# Patient Record
Sex: Male | Born: 2000 | Race: White | Hispanic: No | Marital: Single
Health system: Midwestern US, Community
[De-identification: ages and names within clinical notes are randomized; demographics above are authoritative.]

## PROBLEM LIST (undated history)

## (undated) DIAGNOSIS — J4541 Moderate persistent asthma with (acute) exacerbation: Secondary | ICD-10-CM

## (undated) DIAGNOSIS — T7840XD Allergy, unspecified, subsequent encounter: Secondary | ICD-10-CM

## (undated) DIAGNOSIS — R062 Wheezing: Secondary | ICD-10-CM

---
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
## 2021-05-13 ENCOUNTER — Encounter: Attending: Family

## 2021-05-20 ENCOUNTER — Encounter: Attending: Family | Primary: Family

## 2021-05-20 ENCOUNTER — Ambulatory Visit: Admit: 2021-05-20 | Discharge: 2021-05-20 | Payer: PRIVATE HEALTH INSURANCE | Attending: Family | Primary: Family

## 2021-05-20 DIAGNOSIS — Z0000 Encounter for general adult medical examination without abnormal findings: Secondary | ICD-10-CM

## 2021-05-20 DIAGNOSIS — R062 Wheezing: Secondary | ICD-10-CM

## 2021-05-20 MED ORDER — ALBUTEROL SULFATE HFA 108 (90 BASE) MCG/ACT IN AERS
108 (90 Base) MCG/ACT | Freq: Four times a day (QID) | RESPIRATORY_TRACT | 1 refills | Status: DC | PRN
Start: 2021-05-20 — End: 2021-05-20

## 2021-05-20 MED ORDER — ALBUTEROL SULFATE HFA 108 (90 BASE) MCG/ACT IN AERS
10890 (90 Base) MCG/ACT | Freq: Four times a day (QID) | RESPIRATORY_TRACT | 2 refills | Status: DC | PRN
Start: 2021-05-20 — End: 2021-06-18

## 2021-05-20 MED ORDER — ALBUTEROL SULFATE HFA 108 (90 BASE) MCG/ACT IN AERS
108 (90 Base) MCG/ACT | Freq: Four times a day (QID) | RESPIRATORY_TRACT | 0 refills | Status: DC | PRN
Start: 2021-05-20 — End: 2021-05-20

## 2021-05-20 NOTE — Progress Notes (Signed)
[facility]  [facility]  [address], [place], [place] [address]  [phone]    Tim Ayers is a 21 y.o. male who presents today for his medical conditions/complaints as noted below.  Tim Ayers is c/o of New Patient    Chief Complaint   Patient presents with    New Patient     HPI:     Tim Ayers presents as a new patient to this provider. He states he hasn't been seen by a provider in many years.     Does states he has a history of asthma in which is poorly managed. States he has an extensive history of smoking/vaping. Now requires albuterol daily or else he will experience an asthma attack. Has never used a controller inhaler.     Denies other acute concerns.     Entire medical history, surgical history, family history, allergies, social history, and health maintenance items reviewed and updated as captured in the relevant section of patient's chart.          Past Medical History:   Diagnosis Date    Asthma       Past Surgical History:   Procedure Laterality Date    TONSILLECTOMY       Family History   Problem Relation Age of Onset    Asthma Mother     No Known Problems Brother        Social History     Tobacco Use    Smoking status: Former     Packs/day: 0.50     Types: Cigarettes     Start date: 04/07/2014     Passive exposure: Never    Smokeless tobacco: Never   Substance Use Topics    Alcohol use: Never        Current Outpatient Medications   Medication Sig Dispense Refill    albuterol sulfate HFA (VENTOLIN HFA) 108 (90 Base) MCG/ACT inhaler Inhale 2 puffs into the lungs 4 times daily as needed for Wheezing 18 g 2     No current facility-administered medications for this visit.       No Known Allergies    Subjective:      Review of Systems   Constitutional:  Negative for fever.   HENT:  Negative for sinus pain and sore throat.    Respiratory:  Positive for cough, chest tightness and wheezing. Negative for shortness of breath.    Cardiovascular:  Negative  for chest pain and palpitations.   Gastrointestinal:  Negative for constipation, diarrhea, nausea and vomiting.   Genitourinary:  Negative for dysuria and urgency.   Skin:  Negative for rash.   Neurological:  Negative for dizziness and weakness.     Objective:     Vitals:    05/20/21 1003   BP: 122/80   Site: Right Upper Arm   Position: Sitting   Cuff Size: Medium Adult   Pulse: 80   Resp: 12   Temp: 97.7 ??F (36.5 ??C)   TempSrc: Temporal   SpO2: 97%   Weight: 189 lb (85.7 kg)   Height: 5\' 10"  (1.778 m)       Physical Exam  Constitutional:       Appearance: Normal appearance. He is well-developed.   HENT:      Head: Normocephalic.      Right Ear: Hearing and external ear normal.      Left Ear: Hearing and external ear normal.      Nose: Nose  normal.   Eyes:      General: Lids are normal. Lids are everted, no foreign bodies appreciated.      Conjunctiva/sclera: Conjunctivae normal.      Pupils: Pupils are equal, round, and reactive to light.   Neck:      Thyroid: No thyroid mass.      Vascular: Normal carotid pulses. No carotid bruit or JVD.   Cardiovascular:      Rate and Rhythm: Normal rate and regular rhythm. No extrasystoles are present.     Chest Wall: PMI is not displaced.      Pulses:           Radial pulses are 2+ on the right side and 2+ on the left side.        Dorsalis pedis pulses are 2+ on the right side and 2+ on the left side.      Heart sounds: Normal heart sounds, S1 normal and S2 normal. Heart sounds not distant. No murmur heard.    No friction rub. No gallop. No S3 or S4 sounds.   Pulmonary:      Effort: Pulmonary effort is normal. No respiratory distress.      Breath sounds: Wheezing (Forced expiratory wheezing) present. No decreased breath sounds, rhonchi or rales.   Abdominal:      General: Bowel sounds are normal. There is no distension.      Palpations: Abdomen is soft.      Tenderness: There is no abdominal tenderness. There is no rebound.   Musculoskeletal:         General: Normal range of  motion.      Cervical back: Full passive range of motion without pain, normal range of motion and neck supple.   Skin:     General: Skin is warm and dry.   Neurological:      Cranial Nerves: No cranial nerve deficit.   Psychiatric:         Speech: Speech normal.         Behavior: Behavior normal.         Thought Content: Thought content normal.         Judgment: Judgment normal.       Assessment & Plan:     The following diagnoses and conditions are stable with no further orders unless indicated:    1. Wheezing    2. History of smoking    3. Encounter to establish care        Tim Ayers was seen today for new patient.    Diagnoses and all orders for this visit:    Wheezing  -     Discontinue: albuterol sulfate HFA (VENTOLIN HFA) 108 (90 Base) MCG/ACT inhaler; Inhale 2 puffs into the lungs 4 times daily as needed for Wheezing  -     Full PFT Study With Bronchodilator; Future  -     Discontinue: albuterol sulfate HFA (VENTOLIN HFA) 108 (90 Base) MCG/ACT inhaler; Inhale 2 puffs into the lungs 4 times daily as needed for Wheezing  -     albuterol sulfate HFA (VENTOLIN HFA) 108 (90 Base) MCG/ACT inhaler; Inhale 2 puffs into the lungs 4 times daily as needed for Wheezing    History of smoking  -     Discontinue: albuterol sulfate HFA (VENTOLIN HFA) 108 (90 Base) MCG/ACT inhaler; Inhale 2 puffs into the lungs 4 times daily as needed for Wheezing  -     Full PFT Study With Bronchodilator; Future  -  Discontinue: albuterol sulfate HFA (VENTOLIN HFA) 108 (90 Base) MCG/ACT inhaler; Inhale 2 puffs into the lungs 4 times daily as needed for Wheezing  -     albuterol sulfate HFA (VENTOLIN HFA) 108 (90 Base) MCG/ACT inhaler; Inhale 2 puffs into the lungs 4 times daily as needed for Wheezing    Recommend PFT to evaluate lungs. Symptoms suggestive of poorly controlled reactive airway. SABA for the interim time being. Likely would benefit from ICS/LABA to prevent SABA overuse/exacerbation    Encounter to establish care    Discussed  healthy lifestyle habits at length (encouraged regular exercise, healthy diet, no smoking, adequate sleep, sunscreen, safety items (car/driving, illness prevention.)    ?? A preventive eye exam performed by an eye specialist is recommended every 1-2 years to screen for glaucoma; cataracts, macular degeneration, and other eye disorders.  ?? A preventive dental visit is recommended every 6 months.  ?? Try to get at least 150 minutes of exercise per week or 10,000 steps per day on a pedometer .  ?? You need 1200-1500 mg of calcium and 1000-2000 IU of vitamin D per day. It is possible to meet your calcium requirement with diet alone, but a vitamin D supplement is usually necessary to meet this goal.  ?? When exposed to the sun, use a sunscreen that protects against both UVA and UVB radiation with an SPF of 30 or greater. Reapply every 2 to 3 hours or after sweating, drying off with a towel, or swimming.  ?? Always wear a seat belt when traveling in a car. Always wear a helmet when riding a bicycle or motorcycle.     No follow-ups on file.      Patientshould call the office immediately with new or ongoing signs or symptoms or worsening, or proceed to the emergency room.  If you are on medications which could impair your senses, you are at risk of weakness, falls,dizziness, or drowsiness.  You should be careful during activities which could place you at risk of harm, such as climbing, using stairs, operating machinery, or driving vehicles.  If you feel you cannot safely do theseactivities, you should request others to help you, or avoid the activities altogether. If you are drowsy for any other reason, you should use the same precautions as listed above.    Call if pattern of symptoms change or persists for an extended time.      Tim Ayers

## 2021-06-03 ENCOUNTER — Inpatient Hospital Stay: Admit: 2021-06-03 | Payer: PRIVATE HEALTH INSURANCE | Primary: Family

## 2021-06-03 DIAGNOSIS — R062 Wheezing: Secondary | ICD-10-CM

## 2021-06-03 MED ORDER — ALBUTEROL SULFATE HFA 108 (90 BASE) MCG/ACT IN AERS
108 (90 Base) MCG/ACT | Freq: Once | RESPIRATORY_TRACT | Status: AC
Start: 2021-06-03 — End: 2021-06-03
  Administered 2021-06-03: 15:00:00 4 via RESPIRATORY_TRACT

## 2021-06-03 MED FILL — PROVENTIL HFA 108 (90 BASE) MCG/ACT IN AERS: 108 (90 Base) MCG/ACT | RESPIRATORY_TRACT | Qty: 8

## 2021-06-03 NOTE — Procedures (Signed)
[facility]                 [address] [place], [place] [address]                               PULMONARY FUNCTION    PATIENT NAME: Tim Ayers, MAIOLO                        DOB:        22-Mar-2001  MED REC NO:   [id number]                          ROOM:  ACCOUNT NO:   [id number]                           ADMIT DATE: 06/03/2021  PROVIDER:     Lynnae January, MD    DATE OF PROCEDURE:  06/03/2021    PFT    Spirometry showed FVC 6.06 L, 111% predicted, FEV1 4.18 L, 90%  predicted, with FEV1/FVC ratio of 69%.  There was no response to  bronchodilator.  Lung volumes showed evidence of air trapping, diffusion  capacity was normal.    IMPRESSION:  Except for air trapping this is a normal PFT.  This can be  seen with asthma.  Clinical correlation is recommended.        Lynnae January, MD    D: 06/04/2021 8:58:43       T: 06/04/2021 11:25:55     [id number]  Job#: [id number]     Doc#: [id number]    CC:  Lavell Luster, CNP

## 2021-06-04 LAB — FULL PFT STUDY WITH BRONCHODILATOR
DLCO %Pred: 123 %
FEV1 %Pred-Post: 97 %
FEV1 %Pred-Pre: 90 %
FEV1/FVC %Pred-Post: 86 %
FEV1/FVC %Pred-Pre: 80 %
FVC %Pred-Post: 111 L
FVC %Pred-Pre: 111 %
TLC %Pred: 110 %

## 2021-06-06 MED ORDER — BUDESONIDE-FORMOTEROL FUMARATE 80-4.5 MCG/ACT IN AERO
RESPIRATORY_TRACT | 1 refills | Status: DC
Start: 2021-06-06 — End: 2021-06-18

## 2021-06-06 MED ORDER — METHYLPREDNISOLONE 4 MG PO TBPK
4 MG | PACK | ORAL | 0 refills | Status: AC
Start: 2021-06-06 — End: 2021-06-12

## 2021-06-06 MED ORDER — MONTELUKAST SODIUM 10 MG PO TABS
10 MG | ORAL_TABLET | Freq: Every evening | ORAL | 0 refills | Status: AC
Start: 2021-06-06 — End: 2021-08-01

## 2021-06-06 MED ORDER — DOXYCYCLINE HYCLATE 100 MG PO TABS
100 MG | ORAL_TABLET | Freq: Two times a day (BID) | ORAL | 0 refills | Status: AC
Start: 2021-06-06 — End: 2021-06-13

## 2021-06-06 MED ORDER — ONDANSETRON 4 MG PO TBDP
4 MG | ORAL_TABLET | Freq: Three times a day (TID) | ORAL | 0 refills | Status: AC | PRN
Start: 2021-06-06 — End: ?

## 2021-06-06 NOTE — Progress Notes (Signed)
[facility]  [address], [place] [address]  [phone]    Tim Ayers is a 21 y.o. male who presents today for his medical conditions/complaints as noted below.  Donnavin Vandenbrink is c/o of Follow-up (PFT Test Results, continued breathing problems. )      Chief Complaint   Patient presents with    Follow-up     PFT Test Results, continued breathing problems.        HPI:     Asthma Symptoms:  Patient complains of longstanding history of moderate chest tightness, dyspnea, wheezing, and dyspnea on exertion.  He denies any other symptoms.  He is awoken from sleep by asthma symptoms approximately 1 time(s) per night, and has been using his rescue inhaler/nebulizer 3 time(s) per day with minimal relief. Symptoms are worsening over time.  Peak flow monitoring:  Yes - recent PFT which showed air trapping.  Suspected triggers include airborne allergens, exercise, poor air quality, strong odors. He  reports that he has quit smoking. His smoking use included cigarettes. He started smoking about 7 years ago. He smoked an average of .5 packs per day. He has never been exposed to tobacco smoke. He has never used smokeless tobacco.  Current treatment includes beta agonist inhalers. Using preventive medication(s) consistently: not applicable.   Past treatment includes beta agonist inhalers, steroid/abx.  ED treatment for asthma symptoms:  No.  Asthma-related hospitalization No.  Prior PFTs: No.  Prior pneumococcal vaccination: No.  Current pulmonologist or allergist:  No.        Past Medical History:   Diagnosis Date    Asthma         Past Surgical History:   Procedure Laterality Date    TONSILLECTOMY         Family History   Problem Relation Age of Onset    Asthma Mother     No Known Problems Brother        Social History     Tobacco Use    Smoking status: Former     Packs/day: 0.50     Types: Cigarettes     Start date: 04/07/2014     Passive exposure: Never     Smokeless tobacco: Never   Substance Use Topics    Alcohol use: Never        Current Outpatient Medications   Medication Sig Dispense Refill    doxycycline hyclate (VIBRA-TABS) 100 MG tablet Take 1 tablet by mouth 2 times daily for 7 days 14 tablet 0    methylPREDNISolone (MEDROL DOSEPACK) 4 MG tablet Take by mouth. 1 kit 0    budesonide-formoterol (SYMBICORT) 80-4.5 MCG/ACT AERO Inhale 2 puffs twice per day. Rinse mouth after taking inhaler. 10.2 g 1    ondansetron (ZOFRAN-ODT) 4 MG disintegrating tablet Take 1 tablet by mouth 3 times daily as needed for Nausea or Vomiting 21 tablet 0    montelukast (SINGULAIR) 10 MG tablet Take 1 tablet by mouth nightly 30 tablet 0    albuterol sulfate HFA (VENTOLIN HFA) 108 (90 Base) MCG/ACT inhaler Inhale 2 puffs into the lungs 4 times daily as needed for Wheezing 18 g 2     No current facility-administered medications for this visit.       No Known Allergies    Subjective:      Review of Systems   Constitutional:  Positive for activity change and fatigue. Negative for appetite change and fever.   HENT:  Positive for congestion, postnasal drip, rhinorrhea, sinus pressure and sinus pain. Negative for sore throat.    Eyes:  Negative for photophobia and visual disturbance.   Respiratory:  Positive for cough, chest tightness, shortness of breath and wheezing.    Cardiovascular:  Negative for chest pain, palpitations and leg swelling.   Gastrointestinal:  Negative for abdominal distention, abdominal pain, anal bleeding, blood in stool, constipation, diarrhea, nausea and vomiting.   Genitourinary:  Negative for decreased urine volume, difficulty urinating, dysuria and urgency.   Skin:  Negative for rash.   Neurological:  Negative for dizziness and weakness.     Objective:     Vitals:    06/06/21 1106   BP: 122/78   Site: Right Upper Arm   Position: Sitting   Cuff Size: Medium Adult   Pulse: 77   Resp: 16   Temp: 97.1 ??F (36.2 ??C)   TempSrc: Temporal   SpO2: 97%   Weight: 191 lb (86.6  kg)       Physical Exam  Constitutional:       Appearance: Normal appearance. He is well-developed.   HENT:      Head: Normocephalic.      Right Ear: Hearing and external ear normal.      Left Ear: Hearing and external ear normal.      Nose: Nose normal.   Eyes:      General: Lids are normal. Lids are everted, no foreign bodies appreciated.      Conjunctiva/sclera: Conjunctivae normal.      Pupils: Pupils are equal, round, and reactive to light.   Neck:      Thyroid: No thyroid mass.      Vascular: Normal carotid pulses. No carotid bruit or JVD.   Cardiovascular:      Rate and Rhythm: Normal rate and regular rhythm. No extrasystoles are present.     Chest Wall: PMI is not displaced.      Pulses:           Radial pulses are 2+ on the right side and 2+ on the left side.        Dorsalis pedis pulses are 2+ on the right side and 2+ on the left side.      Heart sounds: Normal heart sounds, S1 normal and S2 normal. Heart sounds not distant. No murmur heard.    No friction rub. No gallop. No S3 or S4 sounds.   Pulmonary:      Effort: Pulmonary effort is normal. No respiratory distress.      Breath sounds: Examination of the right-upper field reveals wheezing. Examination of the left-upper field reveals wheezing. Examination of the right-middle field reveals wheezing. Wheezing (Inspiratory) present. No decreased breath sounds, rhonchi or rales.   Abdominal:      General: Bowel sounds are normal. There is no distension.      Palpations: Abdomen is soft.      Tenderness: There is no abdominal tenderness. There is no rebound.   Musculoskeletal:         General: Normal range of motion.      Cervical back: Full passive range of motion without pain, normal range of motion and neck supple.   Skin:     General: Skin is warm and dry.   Neurological:      Cranial Nerves: No cranial nerve deficit.   Psychiatric:         Speech: Speech normal.         Behavior: Behavior  normal.         Thought Content: Thought content normal.          Judgment: Judgment normal.       Assessment & Plan:     The following diagnoses and conditions are stable with no further orders unless indicated:    1. Moderate persistent asthma with acute exacerbation    2. Allergy, subsequent encounter        Keyshon was seen today for follow-up.    Diagnoses and all orders for this visit:    Moderate persistent asthma with acute exacerbation  -     doxycycline hyclate (VIBRA-TABS) 100 MG tablet; Take 1 tablet by mouth 2 times daily for 7 days  -     methylPREDNISolone (MEDROL DOSEPACK) 4 MG tablet; Take by mouth.  -     budesonide-formoterol (SYMBICORT) 80-4.5 MCG/ACT AERO; Inhale 2 puffs twice per day. Rinse mouth after taking inhaler.  -     ondansetron (ZOFRAN-ODT) 4 MG disintegrating tablet; Take 1 tablet by mouth 3 times daily as needed for Nausea or Vomiting    Allergy, subsequent encounter  -     montelukast (SINGULAIR) 10 MG tablet; Take 1 tablet by mouth nightly      Recommend beginning maintenance of asthma with combo inhaler to decrease the need for SABA. Will also add singulair seeing he is quite reactive to allergies in the springtime.     Steroid/abx to counter suspected sinsusitis which may be moving toward a bronchitis.     Return in about 4 weeks (around 07/04/2021).      Patientshould call the office immediately with new or ongoing signs or symptoms or worsening, or proceed to the emergency room.  If you are on medications which could impair your senses, you are at risk of weakness, falls,dizziness, or drowsiness.  You should be careful during activities which could place you at risk of harm, such as climbing, using stairs, operating machinery, or driving vehicles.  If you feel you cannot safely do theseactivities, you should request others to help you, or avoid the activities altogether. If you are drowsy for any other reason, you should use the same precautions as listed above.    Call if pattern of symptoms change or persists for an extended time.      Gayleen Orem

## 2021-06-07 MED FILL — VENTOLIN HFA 108 (90 BASE) MCG/ACT IN AERS: 108 (90 Base) MCG/ACT | RESPIRATORY_TRACT | Qty: 4

## 2021-06-14 ENCOUNTER — Emergency Department: Admit: 2021-06-15 | Payer: PRIVATE HEALTH INSURANCE | Primary: Family

## 2021-06-14 DIAGNOSIS — R1013 Epigastric pain: Secondary | ICD-10-CM

## 2021-06-14 NOTE — ED Provider Notes (Signed)
[facility]  [facility]  EMERGENCY DEPARTMENT ENCOUNTER        Pt Name: Tim RoundJesse Ayers  MRN: [id number]  Birthdate 2000-12-02  Date of evaluation: 06/14/2021  Provider: Mee Hiveshristina Zerick Prevette, PA  PCP: Lavell LusterBen Werneke, APRN - CNP  Note Started: 8:10 PM EST 06/14/21      APP. I have evaluated this patient.  My supervising physician was available for consultation.      CHIEF COMPLAINT       Chief Complaint   Patient presents with    Abdominal Pain     Mid abd pain starting today. Emesis. 7/10 pain         HISTORY OF PRESENT ILLNESS: 1 or more Elements     History from : Patient    Limitations to history : None    Tim Ayers is a 21 y.o. male who presents to the emergency department complaining of abdominal pain.  Patient states pain is epigastric and around his bellybutton.  He has had several episodes of nausea and vomiting.  Currently rates pain as 7/10.  Patient states he has past medical history of asthma.  He states he recently was seen by his primary care physician for asthma exacerbation and was prescribed steroids and antibiotics.  Just finished prescriptions yesterday.  As he was throwing up today he had a couple streaks of bright red blood in the vomit.     Nursing Notes were all reviewed and agreed with or any disagreements were addressed in the HPI.    REVIEW OF SYSTEMS :      Review of Systems    Positives and Pertinent negatives as per HPI.     SURGICAL HISTORY     Past Surgical History:   Procedure Laterality Date    TONSILLECTOMY         CURRENTMEDICATIONS       Discharge Medication List as of 06/14/2021  9:57 PM        CONTINUE these medications which have NOT CHANGED    Details   ondansetron (ZOFRAN-ODT) 4 MG disintegrating tablet Take 1 tablet by mouth 3 times daily as needed for Nausea or Vomiting, Disp-21 tablet, R-0Normal      montelukast (SINGULAIR) 10 MG tablet Take 1 tablet by mouth nightly, Disp-30 tablet, R-0Normal      budesonide-formoterol (SYMBICORT) 80-4.5 MCG/ACT AERO Inhale 2 puffs twice  per day. Rinse mouth after taking inhaler., Disp-10.2 g, R-1Normal      albuterol sulfate HFA (VENTOLIN HFA) 108 (90 Base) MCG/ACT inhaler Inhale 2 puffs into the lungs 4 times daily as needed for Wheezing, Disp-18 g, R-2Normal             ALLERGIES     Claritin [loratadine] and Cetirizine    FAMILYHISTORY       Family History   Problem Relation Age of Onset    Asthma Mother     No Known Problems Brother         SOCIAL HISTORY       Social History     Tobacco Use    Smoking status: Former     Packs/day: 0.50     Types: Cigarettes     Start date: 04/07/2014     Passive exposure: Never    Smokeless tobacco: Never   Substance Use Topics    Alcohol use: Never    Drug use: Yes     Types: Marijuana (Weed)       SCREENINGS  Glasgow Coma Scale  Eye Opening: Spontaneous  Best Verbal Response: Oriented  Best Motor Response: Obeys commands  Glasgow Coma Scale Score: 15                CIWA Assessment  BP: 125/60  Heart Rate: 95           PHYSICAL EXAM  1 or more Elements     ED Triage Vitals [06/14/21 1957]   BP Temp Temp Source Heart Rate Resp SpO2 Height Weight   125/60 98.6 ??F (37 ??C) Oral (!) 128 18 97 % 5\' 10"  (1.778 m) 190 lb (86.2 kg)       Physical Exam  Vitals and nursing note reviewed.   Constitutional:       Appearance: Normal appearance. He is not diaphoretic.   HENT:      Head: Normocephalic and atraumatic.      Nose: Nose normal.      Mouth/Throat:      Mouth: Mucous membranes are moist.   Eyes:      General:         Right eye: No discharge.         Left eye: No discharge.      Extraocular Movements: Extraocular movements intact.      Pupils: Pupils are equal, Ayers, and reactive to light.   Pulmonary:      Effort: Pulmonary effort is normal. No respiratory distress.   Abdominal:      General: Abdomen is flat.      Palpations: Abdomen is soft.      Tenderness: There is abdominal tenderness in the epigastric area. There is no guarding or rebound.   Musculoskeletal:         General: Normal range of motion.       Cervical back: Normal range of motion and neck supple.   Skin:     General: Skin is warm and dry.      Coloration: Skin is not pale.   Neurological:      Mental Status: He is alert and oriented to person, place, and time.   Psychiatric:         Mood and Affect: Mood normal.         Behavior: Behavior normal.           DIAGNOSTIC RESULTS   LABS:    Labs Reviewed   CBC WITH AUTO DIFFERENTIAL - Abnormal; Notable for the following components:       Result Value    WBC 19.0 (*)     Neutrophils Absolute 15.8 (*)     All other components within normal limits   COMPREHENSIVE METABOLIC PANEL W/ REFLEX TO MG FOR LOW K - Abnormal; Notable for the following components:    Sodium 133 (*)     CO2 19 (*)     Glucose 119 (*)     Creatinine 0.8 (*)     Total Bilirubin 1.2 (*)     All other components within normal limits   URINALYSIS WITH REFLEX TO CULTURE - Abnormal; Notable for the following components:    Ketones, Urine 15 (*)     Blood, Urine TRACE-INTACT (*)     Leukocyte Esterase, Urine TRACE (*)     All other components within normal limits   MICROSCOPIC URINALYSIS - Abnormal; Notable for the following components:    Mucus, UA 3+ (*)     Bacteria, UA Rare (*)     All other components within normal  limits   LIPASE   LACTIC ACID       When ordered only abnormal lab results are displayed. All other labs were within normal range or not returned as of this dictation.    EKG: When ordered, EKG's are interpreted by the Emergency Department Physician in the absence of a cardiologist.  Please see their note for interpretation of EKG.    RADIOLOGY:   Non-plain film images such as CT, Ultrasound and MRI are read by the radiologist. Plain radiographic images are visualized and preliminarily interpreted by the ED Provider with the below findings:    CT abdomen pelvis without acute process.    Interpretation per the Radiologist below, if available at the time of this note:    CT ABDOMEN PELVIS W IV CONTRAST Additional Contrast? None    Final Result   No acute infectious or inflammatory process in the abdomen or pelvis.           No results found.    No results found.    PROCEDURES   Unless otherwise noted below, none     Procedures    CRITICAL CARE TIME (.cctime)   None    PAST MEDICAL HISTORY      has a past medical history of Asthma.     Chronic Conditions affecting Care: Asthma    EMERGENCY DEPARTMENT COURSE and DIFFERENTIAL DIAGNOSIS/MDM:   Vitals:    Vitals:    06/14/21 1957 06/14/21 2145   BP: 125/60    Pulse: (!) 128 95   Resp: 18 18   Temp: 98.6 ??F (37 ??C)    TempSrc: Oral    SpO2: 97% 99%   Weight: 190 lb (86.2 kg)    Height: 5\' 10"  (1.778 m)        Patient was given the following medications:  Medications   0.9 % sodium chloride bolus (0 mLs IntraVENous Stopped 06/14/21 2148)   ketorolac (TORADOL) injection 15 mg (15 mg IntraVENous Given 06/14/21 2025)   ondansetron (ZOFRAN) injection 4 mg (4 mg IntraVENous Given 06/14/21 2025)   aluminum & magnesium hydroxide-simethicone (MAALOX) 30 mL, lidocaine viscous hcl (XYLOCAINE) 5 mL (GI COCKTAIL) ( Oral Given 06/14/21 2026)   iopamidol (ISOVUE-370) 76 % injection 75 mL (75 mLs IntraVENous Given 06/14/21 2102)             Is this patient to be included in the SEP-1 Core Measure due to severe sepsis or septic shock?   No   Exclusion criteria - the patient is NOT to be included for SEP-1 Core Measure due to:  Alternative explanation for abnormal labs/vitals that do not relate to sepsis, see MDM for further explanation    CONSULTS: (Who and What was discussed)  None  Discussion with Other Profesionals : None    Social Determinants : None    Records Reviewed : None    CC/HPI Summary, DDx, ED Course, and Reassessment: Patient was evaluated in the emergency department today for epigastric abdominal pain, nausea and vomiting.  He did try taking Zofran prior to arrival.  Patient was recently treated with antibiotics for bronchitis.  Differential diagnosis includes dyspepsia, appendicitis, pancreatitis,  gastroenteritis, kidney stone, other.    Work-up results include:  CBC with leukocytosis of 19 with increased absolute neutrophil count of 15.8.  No acute anemia.  CMP with sodium of 133, CO2 is 19, glucose is 119, renal function maintained.  No transaminitis, slightly elevated total bilirubin at 1.2 although I think suspect that this is  due to dehydration.  Lipase is 35  Urinalysis with ketones and trace blood intact.  Micro urinalysis with 3+ mucus and rare bacteria  CT abdomen and pelvis shows no acute infectious or inflammatory process.    Patient was given Toradol, Zofran and IV fluids for symptom relief.  Also given GI cocktail.  Upon reevaluation the patient's symptoms have almost completely resolved at this point.  Discussion was had with the patient regarding all lab and imaging results.  At this point I do feel patient is appropriate for discharge home.  He will follow-up with his primary care physician for further evaluation and treatment in the next 3 days.  Return precautions are given.  All questions are answered he is discharged home in good condition.    Disposition Considerations (include 1 Tests not done, Shared Decision Making, Pt Expectation of Test or Tx.):       Results for orders placed or performed during the hospital encounter of 06/14/21   CBC with Diff   Result Value Ref Range    WBC 19.0 (H) 4.0 - 11.0 K/uL    RBC 5.51 4.20 - 5.90 M/uL    Hemoglobin 16.5 13.5 - 17.5 g/dL    Hematocrit 16.1 09.6 - 52.5 %    MCV 87.4 80.0 - 100.0 fL    MCH 30.0 26.0 - 34.0 pg    MCHC 34.3 31.0 - 36.0 g/dL    RDW 04.5 40.9 - 81.1 %    Platelets 278 135 - 450 K/uL    MPV 8.7 5.0 - 10.5 fL    Neutrophils % 83.3 %    Lymphocytes % 9.7 %    Monocytes % 5.4 %    Eosinophils % 1.3 %    Basophils % 0.3 %    Neutrophils Absolute 15.8 (H) 1.7 - 7.7 K/uL    Lymphocytes Absolute 1.8 1.0 - 5.1 K/uL    Monocytes Absolute 1.0 0.0 - 1.3 K/uL    Eosinophils Absolute 0.2 0.0 - 0.6 K/uL    Basophils Absolute 0.1 0.0 - 0.2  K/uL   CMP w/ Reflex to MG   Result Value Ref Range    Sodium 133 (L) 136 - 145 mmol/L    Potassium reflex Magnesium 4.3 3.5 - 5.1 mmol/L    Chloride 99 99 - 110 mmol/L    CO2 19 (L) 21 - 32 mmol/L    Anion Gap 15 3 - 16    Glucose 119 (H) 70 - 99 mg/dL    BUN 15 7 - 20 mg/dL    Creatinine 0.8 (L) 0.9 - 1.3 mg/dL    Est, Glom Filt Rate >60 >60    Calcium 9.5 8.3 - 10.6 mg/dL    Total Protein 7.2 6.4 - 8.2 g/dL    Albumin 4.5 3.4 - 5.0 g/dL    Albumin/Globulin Ratio 1.7 1.1 - 2.2    Total Bilirubin 1.2 (H) 0.0 - 1.0 mg/dL    Alkaline Phosphatase 60 40 - 129 U/L    ALT 15 10 - 40 U/L    AST 19 15 - 37 U/L   Lipase   Result Value Ref Range    Lipase 35.0 13.0 - 60.0 U/L   Urinalysis with Reflex to Culture    Specimen: Urine   Result Value Ref Range    Color, UA Yellow Straw/Yellow    Clarity, UA Clear Clear    Glucose, Ur Negative Negative mg/dL    Bilirubin Urine Negative Negative  Ketones, Urine 15 (A) Negative mg/dL    Specific Gravity, UA 1.025 1.005 - 1.030    Blood, Urine TRACE-INTACT (A) Negative    pH, UA 6.0 5.0 - 8.0    Protein, UA Negative Negative mg/dL    Urobilinogen, Urine 0.2 <2.0 E.U./dL    Nitrite, Urine Negative Negative    Leukocyte Esterase, Urine TRACE (A) Negative    Microscopic Examination YES     Urine Type NotGiven     Urine Reflex to Culture Not Indicated    Microscopic Urinalysis   Result Value Ref Range    Mucus, UA 3+ (A) None Seen /LPF    WBC, UA 3-5 0 - 5 /HPF    RBC, UA 3-4 0 - 4 /HPF    Epithelial Cells, UA 0-1 0 - 5 /HPF    Bacteria, UA Rare (A) None Seen /HPF    Amorphous, UA 2+ /HPF   Lactic Acid   Result Value Ref Range    Lactic Acid 1.0 0.4 - 2.0 mmol/L         I estimate there is LOW risk for ACUTE APPENDICITIS, BOWEL OBSTRUCTION, CHOLECYSTITIS, DIVERTICULITIS, INCARCERATED HERNIA, PANCREATITIS, or PERFORATED BOWEL or ULCER, thus I consider the discharge disposition reasonable. Also, there is no evidence or peritonitis, sepsis, or toxicity. Tim Ayers and I have discussed the  diagnosis and risks, and we agree with discharging home to follow-up with their primary doctor. We also discussed returning to the Emergency Department immediately if new or worsening symptoms occur. We have discussed the symptoms which are most concerning (e.g., bloody stool, fever, changing or worsening pain, vomiting) that necessitate immediate return.     FINAL Impression    1. Epigastric pain        Blood pressure 125/60, pulse 95, temperature 98.6 ??F (37 ??C), temperature source Oral, resp. rate 18, height  (1.778 m), weight 190 lb (86.2 kg), SpO2 99 %.      I am the Primary Clinician of Record.    FINAL IMPRESSION      1. Epigastric pain          DISPOSITION/PLAN     DISPOSITION Decision To Discharge 06/14/2021 09:54:47 PM      PATIENT REFERRED TO:  [facility]  ED  [address]  [place] [address]  [phone]  Go to   If symptoms worsen    Lavell Luster, APRN - CNP  [address]  [address] [place] [address]  [phone]          DISCHARGE MEDICATIONS:  Discharge Medication List as of 06/14/2021  9:57 PM        START taking these medications    Details   promethazine (PHENERGAN) 12.5 MG tablet Take 2 tablets by mouth every 6 hours as needed for Nausea, Disp-20 tablet, R-0Normal             DISCONTINUED MEDICATIONS:  Discharge Medication List as of 06/14/2021  9:57 PM                 (Please note that portions of this note were completed with a voice recognition program.  Efforts were made to edit the dictations but occasionally words are mis-transcribed.)    Mee Hives, PA (electronically signed)           Mee Hives, PA  06/18/21 1752

## 2021-06-15 ENCOUNTER — Encounter: Payer: PRIVATE HEALTH INSURANCE | Primary: Family

## 2021-06-15 ENCOUNTER — Inpatient Hospital Stay
Admit: 2021-06-15 | Discharge: 2021-06-15 | Disposition: A | Discharge status: Home / Self Care | Payer: PRIVATE HEALTH INSURANCE | Attending: Emergency Medicine

## 2021-06-15 LAB — LACTIC ACID: Lactic Acid: 1 mmol/L (ref 0.4–2.0)

## 2021-06-15 LAB — COMPREHENSIVE METABOLIC PANEL W/ REFLEX TO MG FOR LOW K
ALT: 15 U/L (ref 10–40)
AST: 19 U/L (ref 15–37)
Albumin/Globulin Ratio: 1.7 (ref 1.1–2.2)
Albumin: 4.5 g/dL (ref 3.4–5.0)
Alkaline Phosphatase: 60 U/L (ref 40–129)
Anion Gap: 15 (ref 3–16)
BUN: 15 mg/dL (ref 7–20)
CO2: 19 mmol/L — ABNORMAL LOW (ref 21–32)
Calcium: 9.5 mg/dL (ref 8.3–10.6)
Chloride: 99 mmol/L (ref 99–110)
Creatinine: 0.8 mg/dL — ABNORMAL LOW (ref 0.9–1.3)
Est, Glom Filt Rate: >60 (ref >60)
Glucose: 119 mg/dL — ABNORMAL HIGH (ref 70–99)
Potassium reflex Magnesium: 4.3 mmol/L (ref 3.5–5.1)
Sodium: 133 mmol/L — ABNORMAL LOW (ref 136–145)
Total Bilirubin: 1.2 mg/dL — ABNORMAL HIGH (ref 0.0–1.0)
Total Protein: 7.2 g/dL (ref 6.4–8.2)

## 2021-06-15 LAB — URINALYSIS WITH REFLEX TO CULTURE
Bilirubin Urine: NEGATIVE
Glucose, Ur: NEGATIVE mg/dL
Ketones, Urine: 15 mg/dL — AB
Nitrite, Urine: NEGATIVE
Protein, UA: NEGATIVE mg/dL
Specific Gravity, UA: 1.025 (ref 1.005–1.030)
Urobilinogen, Urine: 0.2 E.U./dL (ref <2.0)
pH, UA: 6 (ref 5.0–8.0)

## 2021-06-15 LAB — MICROSCOPIC URINALYSIS

## 2021-06-15 LAB — CBC WITH AUTO DIFFERENTIAL
Basophils %: 0.3 %
Basophils Absolute: 0.1 10*3/uL (ref 0.0–0.2)
Eosinophils %: 1.3 %
Eosinophils Absolute: 0.2 10*3/uL (ref 0.0–0.6)
Hematocrit: 48.2 % (ref 40.5–52.5)
Hemoglobin: 16.5 g/dL (ref 13.5–17.5)
Lymphocytes %: 9.7 %
Lymphocytes Absolute: 1.8 10*3/uL (ref 1.0–5.1)
MCH: 30 pg (ref 26.0–34.0)
MCHC: 34.3 g/dL (ref 31.0–36.0)
MCV: 87.4 fL (ref 80.0–100.0)
MPV: 8.7 fL (ref 5.0–10.5)
Monocytes %: 5.4 %
Monocytes Absolute: 1 10*3/uL (ref 0.0–1.3)
Neutrophils %: 83.3 %
Neutrophils Absolute: 15.8 10*3/uL — ABNORMAL HIGH (ref 1.7–7.7)
Platelets: 278 10*3/uL (ref 135–450)
RBC: 5.51 M/uL (ref 4.20–5.90)
RDW: 13 % (ref 12.4–15.4)
WBC: 19 10*3/uL — ABNORMAL HIGH (ref 4.0–11.0)

## 2021-06-15 LAB — LIPASE: Lipase: 35 U/L (ref 13.0–60.0)

## 2021-06-15 MED ORDER — LIDOCAINE VISCOUS HCL 2 % MT SOLN
2 % | Freq: Once | OROMUCOSAL | Status: AC
Start: 2021-06-15 — End: 2021-06-14
  Administered 2021-06-15: 01:00:00 via ORAL

## 2021-06-15 MED ORDER — PROMETHAZINE HCL 12.5 MG PO TABS
12.5 MG | ORAL_TABLET | Freq: Four times a day (QID) | ORAL | 0 refills | Status: DC | PRN
Start: 2021-06-15 — End: 2021-06-18

## 2021-06-15 MED ORDER — IOPAMIDOL 76 % IV SOLN
76 % | Freq: Once | INTRAVENOUS | Status: AC | PRN
Start: 2021-06-15 — End: 2021-06-14
  Administered 2021-06-15: 02:00:00 75 mL via INTRAVENOUS

## 2021-06-15 MED ORDER — SODIUM CHLORIDE 0.9 % IV BOLUS
0.9 % | Freq: Once | INTRAVENOUS | Status: AC
Start: 2021-06-15 — End: 2021-06-14
  Administered 2021-06-15: 01:00:00 1000 mL via INTRAVENOUS

## 2021-06-15 MED ORDER — ONDANSETRON HCL 4 MG/2ML IJ SOLN
4 MG/2ML | Freq: Once | INTRAMUSCULAR | Status: AC
Start: 2021-06-15 — End: 2021-06-14
  Administered 2021-06-15: 01:00:00 4 mg via INTRAVENOUS

## 2021-06-15 MED ORDER — KETOROLAC TROMETHAMINE 30 MG/ML IJ SOLN
30 MG/ML | Freq: Once | INTRAMUSCULAR | Status: AC
Start: 2021-06-15 — End: 2021-06-14
  Administered 2021-06-15: 01:00:00 15 mg via INTRAVENOUS

## 2021-06-15 MED FILL — ONDANSETRON HCL 4 MG/2ML IJ SOLN: 4 MG/2ML | INTRAMUSCULAR | Qty: 2

## 2021-06-15 MED FILL — KETOROLAC TROMETHAMINE 30 MG/ML IJ SOLN: 30 MG/ML | INTRAMUSCULAR | Qty: 1

## 2021-06-15 MED FILL — MAG-AL PLUS 200-200-20 MG/5ML PO LIQD: 200-200-20 MG/5ML | ORAL | Qty: 30

## 2021-06-18 ENCOUNTER — Ambulatory Visit: Admit: 2021-06-18 | Discharge: 2021-06-18 | Payer: PRIVATE HEALTH INSURANCE | Attending: Family | Primary: Family

## 2021-06-18 DIAGNOSIS — R1110 Vomiting, unspecified: Secondary | ICD-10-CM

## 2021-06-18 MED ORDER — BUDESONIDE-FORMOTEROL FUMARATE 80-4.5 MCG/ACT IN AERO
80-4.5 MCG/ACT | RESPIRATORY_TRACT | 1 refills | Status: DC
Start: 2021-06-18 — End: 2021-08-01

## 2021-06-18 MED ORDER — ALBUTEROL SULFATE HFA 108 (90 BASE) MCG/ACT IN AERS
10890 (90 Base) MCG/ACT | Freq: Four times a day (QID) | RESPIRATORY_TRACT | 2 refills | Status: DC | PRN
Start: 2021-06-18 — End: 2021-08-01

## 2021-06-18 NOTE — Progress Notes (Signed)
[facility]  [facility]  [address] [place], [place] [address]  Tel:[phone]    Tim Ayers is a 21 y.o. male who presents today for his medical conditions/complaints as noted below.  Tim Ayers is c/o of Emesis (Follow Up from ER visit. Intense vomiting. ) and Follow-up (Follow Up from ER visit. Intense vomiting. )      Chief Complaint   Patient presents with    Emesis     Follow Up from ER visit. Intense vomiting.     Follow-up     Follow Up from ER visit. Intense vomiting.        HPI:     Tim Ayers presents after recent ER visit for intractable vomiting over a 1 day period. He states he returned from work at the end of the day and began to violently vomit. He states he vomited for several hours before noting blood in his vomitus and decided to go to the ER. He described upper abdominal pain, fatigue, hot flashes. He did take a dose of ondansetron and prilosec before going to the ER.     No abnormal results during his ER visit. Denies suspicious food intake. Denies exposure to illness. Denies diarrhea, current abdominal pain, bloating/cramping, acid reflux. States he has not had to take zofran since returning home.     Past Medical History:   Diagnosis Date    Asthma         Past Surgical History:   Procedure Laterality Date    TONSILLECTOMY         Family History   Problem Relation Age of Onset    Asthma Mother     No Known Problems Brother        Social History     Tobacco Use    Smoking status: Former     Packs/day: 0.50     Types: Cigarettes     Start date: 04/07/2014     Passive exposure: Never    Smokeless tobacco: Never   Substance Use Topics    Alcohol use: Never        Current Outpatient Medications   Medication Sig Dispense Refill    albuterol sulfate HFA (VENTOLIN HFA) 108 (90 Base) MCG/ACT inhaler Inhale 2 puffs into the lungs 4 times daily as needed for Wheezing 18 g 2    budesonide-formoterol (SYMBICORT) 80-4.5 MCG/ACT AERO Inhale 2 puffs twice  per day. Rinse mouth after taking inhaler. 10.2 g 1    ondansetron (ZOFRAN-ODT) 4 MG disintegrating tablet Take 1 tablet by mouth 3 times daily as needed for Nausea or Vomiting 21 tablet 0    montelukast (SINGULAIR) 10 MG tablet Take 1 tablet by mouth nightly 30 tablet 0     No current facility-administered medications for this visit.       Allergies   Allergen Reactions    Claritin [Loratadine] Hives    Cetirizine Rash       Subjective:      Review of Systems   Constitutional:  Negative for fever.   HENT:  Negative for sinus pain and sore throat.    Respiratory:  Negative for cough, chest tightness and shortness of breath.    Cardiovascular:  Negative for chest pain and palpitations.   Gastrointestinal:  Positive for vomiting. Negative for constipation, diarrhea and nausea.   Genitourinary:  Negative for dysuria and urgency.   Skin:  Negative for rash.   Neurological:  Negative for dizziness and  weakness.     Objective:     Vitals:    06/18/21 1046   BP: 116/72   Site: Right Upper Arm   Position: Sitting   Cuff Size: Medium Adult   Pulse: 67   Resp: 12   Temp: 97.6 ??F (36.4 ??C)   TempSrc: Temporal   SpO2: 97%   Weight: 190 lb (86.2 kg)       Physical Exam  Constitutional:       Appearance: Normal appearance. He is well-developed.   HENT:      Head: Normocephalic.      Right Ear: Hearing and external ear normal.      Left Ear: Hearing and external ear normal.      Nose: Nose normal.   Eyes:      General: Lids are normal. Lids are everted, no foreign bodies appreciated.      Conjunctiva/sclera: Conjunctivae normal.      Pupils: Pupils are equal, round, and reactive to light.   Neck:      Thyroid: No thyroid mass.      Vascular: Normal carotid pulses. No carotid bruit or JVD.   Cardiovascular:      Rate and Rhythm: Normal rate and regular rhythm. No extrasystoles are present.     Chest Wall: PMI is not displaced.      Pulses:           Radial pulses are 2+ on the right side and 2+ on the left side.        Dorsalis  pedis pulses are 2+ on the right side and 2+ on the left side.      Heart sounds: Normal heart sounds, S1 normal and S2 normal. Heart sounds not distant. No murmur heard.    No friction rub. No gallop. No S3 or S4 sounds.   Pulmonary:      Effort: Pulmonary effort is normal. No respiratory distress.      Breath sounds: Normal breath sounds. No decreased breath sounds, wheezing, rhonchi or rales.   Abdominal:      General: Bowel sounds are normal. There is no distension.      Palpations: Abdomen is soft.      Tenderness: There is no abdominal tenderness. There is no rebound.   Musculoskeletal:         General: Normal range of motion.      Cervical back: Full passive range of motion without pain, normal range of motion and neck supple.   Skin:     General: Skin is warm and dry.   Neurological:      Cranial Nerves: No cranial nerve deficit.   Psychiatric:         Speech: Speech normal.         Behavior: Behavior normal.         Thought Content: Thought content normal.         Judgment: Judgment normal.       Assessment & Plan:     The following diagnoses and conditions are stable with no further orders unless indicated:    1. Vomiting, unspecified vomiting type, unspecified whether nausea present    2. Wheezing    3. History of smoking    4. Moderate persistent asthma with acute exacerbation        Tim Ayers was seen today for emesis and follow-up.    Diagnoses and all orders for this visit:    Vomiting, unspecified vomiting type, unspecified whether nausea present  Symptoms largely resolved. Encouraged bland, liquid based diet if symptoms return. Zofran PRN for nausea/vomiting. Denies urinary symptoms including frequency, burning, discharge.     Wheezing  -     albuterol sulfate HFA (VENTOLIN HFA) 108 (90 Base) MCG/ACT inhaler; Inhale 2 puffs into the lungs 4 times daily as needed for Wheezing    History of smoking  -     albuterol sulfate HFA (VENTOLIN HFA) 108 (90 Base) MCG/ACT inhaler; Inhale 2 puffs into the lungs 4  times daily as needed for Wheezing    Moderate persistent asthma with acute exacerbation  -     budesonide-formoterol (SYMBICORT) 80-4.5 MCG/ACT AERO; Inhale 2 puffs twice per day. Rinse mouth after taking inhaler.    Breathing symptoms have improved with symbicort. Continue current plan of care. Albuterol PRN    Reviewed external imaging/testing from previous provider to more thoroughly evaluate current condition. This includes: ER provider notes, imaging, lab results    Return if symptoms worsen or fail to improve.      Patientshould call the office immediately with new or ongoing signs or symptoms or worsening, or proceed to the emergency room.  If you are on medications which could impair your senses, you are at risk of weakness, falls,dizziness, or drowsiness.  You should be careful during activities which could place you at risk of harm, such as climbing, using stairs, operating machinery, or driving vehicles.  If you feel you cannot safely do theseactivities, you should request others to help you, or avoid the activities altogether. If you are drowsy for any other reason, you should use the same precautions as listed above.    Call if pattern of symptoms change or persists for an extended time.      Wess BottsBen Juaquina Machnik,FNP-C

## 2021-07-16 ENCOUNTER — Encounter
Admit: 2021-07-16 | Discharge: 2021-07-16 | Payer: PRIVATE HEALTH INSURANCE | Attending: Registered Nurse | Primary: Family

## 2021-07-16 ENCOUNTER — Encounter: Payer: PRIVATE HEALTH INSURANCE | Primary: Family

## 2021-07-16 DIAGNOSIS — J069 Acute upper respiratory infection, unspecified: Secondary | ICD-10-CM

## 2021-07-16 NOTE — Progress Notes (Signed)
Reviewed questionnaire    Reviewed meds/allergies    Dx URI    Plan Rx given for medrol dose pack, follow up with PCP if no improvement    Time spent on visit 11 min

## 2021-07-18 ENCOUNTER — Encounter: Payer: PRIVATE HEALTH INSURANCE | Attending: Family | Primary: Family

## 2021-08-01 ENCOUNTER — Encounter

## 2021-08-01 MED ORDER — BUDESONIDE-FORMOTEROL FUMARATE 80-4.5 MCG/ACT IN AERO
RESPIRATORY_TRACT | 1 refills | Status: DC
Start: 2021-08-01 — End: 2021-11-16

## 2021-08-01 MED ORDER — ALBUTEROL SULFATE HFA 108 (90 BASE) MCG/ACT IN AERS
108 (90 Base) MCG/ACT | Freq: Four times a day (QID) | RESPIRATORY_TRACT | 2 refills | Status: AC | PRN
Start: 2021-08-01 — End: ?

## 2021-08-01 MED ORDER — MONTELUKAST SODIUM 10 MG PO TABS
10 MG | ORAL_TABLET | Freq: Every evening | ORAL | 0 refills | Status: AC
Start: 2021-08-01 — End: 2021-10-14

## 2021-08-01 NOTE — Telephone Encounter (Signed)
Refill request for Albuterol Sulfate HFA, Budesonide Formoterol, Montelukast medication.     Name of Pharmacy- [facility]      Last visit - 06/18/21     Pending visit - none    Last refill -06/18/21, 06/06/21      Medication Contract signed -   Last Oarrs ran-         Additional Comments

## 2021-10-12 ENCOUNTER — Encounter

## 2021-10-14 ENCOUNTER — Encounter

## 2021-10-14 NOTE — Telephone Encounter (Signed)
Refill request for montelukast (SINGULAIR) 10 MG tablet medication.     Name of Pharmacy- [facility]      Last visit - 06/18/21     Pending visit - None    Last refill - 08/01/21      Medication Contract signed - PDMP Monitoring:    Last PDMP Loraine Leriche as Reviewed:  Review User Review Instant Review Result          @PDMPDOC @  Urine Drug Screenings (1 yr)    No resulted procedures found.       Medication Contract and Consent for Opioid Use Documents Filed        No documents found                   Last ran-         Additional Comments

## 2021-10-15 MED ORDER — MONTELUKAST SODIUM 10 MG PO TABS
10 MG | ORAL_TABLET | ORAL | 11 refills | Status: AC
Start: 2021-10-15 — End: 2022-10-27

## 2021-11-16 ENCOUNTER — Encounter

## 2021-11-18 MED ORDER — BUDESONIDE-FORMOTEROL FUMARATE 80-4.5 MCG/ACT IN AERO
RESPIRATORY_TRACT | 1 refills | Status: AC
Start: 2021-11-18 — End: ?

## 2021-11-18 NOTE — Telephone Encounter (Signed)
Refill request for budesonide-formoterol (SYMBICORT) 80-4.5 MCG/ACT AERO medication.     Name of Pharmacy- [facility]      Last visit - 06-18-2021     Pending visit - N/A    Last refill - 08-01-2021      Medication Contract signed -   Last Modesta Messing ran-         Additional Comments

## 2021-12-30 ENCOUNTER — Encounter

## 2021-12-30 NOTE — Telephone Encounter (Signed)
Refill request for albuterol sulfate HFA (VENTOLIN HFA) 108 (90 Base) MCG/ACT inhaler,budesonide-formoterol (SYMBICORT) 80-4.5 MCG/ACT AERO  medication.     Name of Tim Ayers      Last visit - 06-18-2021     Pending visit - 4-13*-2023    Last refill - 08-01-2021, 11-18-2021      Medication Contract signed -   Last Jacinto Reap ran-         Additional Comments

## 2021-12-31 MED ORDER — BUDESONIDE-FORMOTEROL FUMARATE 80-4.5 MCG/ACT IN AERO
80-4.5 MCG/ACT | RESPIRATORY_TRACT | 1 refills | Status: AC
Start: 2021-12-31 — End: 2022-02-04

## 2021-12-31 MED ORDER — ALBUTEROL SULFATE HFA 108 (90 BASE) MCG/ACT IN AERS
108 | Freq: Four times a day (QID) | RESPIRATORY_TRACT | 2 refills | Status: DC | PRN
Start: 2021-12-31 — End: 2024-04-24

## 2022-02-04 ENCOUNTER — Ambulatory Visit: Admit: 2022-02-04 | Discharge: 2022-02-04 | Payer: PRIVATE HEALTH INSURANCE | Attending: Family | Primary: Family

## 2022-02-04 DIAGNOSIS — F909 Attention-deficit hyperactivity disorder, unspecified type: Secondary | ICD-10-CM

## 2022-02-04 MED ORDER — BUDESONIDE-FORMOTEROL FUMARATE 80-4.5 MCG/ACT IN AERO
RESPIRATORY_TRACT | 1 refills | Status: AC
Start: 2022-02-04 — End: 2022-07-21

## 2022-02-04 MED ORDER — ATOMOXETINE HCL 40 MG PO CAPS
40 MG | ORAL_CAPSULE | Freq: Every day | ORAL | 0 refills | Status: AC
Start: 2022-02-04 — End: 2022-03-06

## 2022-02-04 NOTE — Progress Notes (Signed)
[facility]  [address] [place] [address]  Tel:[phone]    Tim Ayers is a 21 y.o. male who presents today for his medical conditions/complaints as noted below.  Tim Ayers is c/o of Other (ADHD evaluation/recommendation )    Chief Complaint   Patient presents with    Other     ADHD evaluation/recommendation      HPI:     ADD/ADHD Symptoms:  Patient complains of a several year(s) history of inattention, hyperactivity, academic underachievement, anxiety, including the following: fails to give close attention to details or makes careless mistakes in school, work, or other activities, has difficulty sustaining attention in tasks or play activities, has difficulty organizing tasks and activities, is easily distracted by extraneous stimuli, is often forgetful in daily activities, and avoids engaging in tasks that require sustained attention. He states he has experienced these symptoms since he was approximately in middle school, however his parents were hesitant to begin treatment for him. He presents for evaluation of suspected ADD/ADHD.  Patient denies behavior problems, depressed mood, anxiety.  Symptoms have been gradually worsening with time.  Family history of ADD/ADHD: no.  Previous treatment:has included: none.          Past Medical History:   Diagnosis Date    Asthma         Past Surgical History:   Procedure Laterality Date    TONSILLECTOMY         Family History   Problem Relation Age of Onset    Asthma Mother     No Known Problems Brother        Social History     Tobacco Use    Smoking status: Former     Packs/day: .5     Types: Cigarettes     Start date: 04/07/2014     Passive exposure: Never    Smokeless tobacco: Never   Substance Use Topics    Alcohol use: Never        Current Outpatient Medications   Medication Sig Dispense Refill    atomoxetine (STRATTERA) 40 MG capsule Take 1 capsule by mouth daily 30 capsule 0     budesonide-formoterol (SYMBICORT) 80-4.5 MCG/ACT AERO Inhale 2 puffs twice per day. Rinse mouth after taking inhaler. 10.2 g 1    albuterol sulfate HFA (VENTOLIN HFA) 108 (90 Base) MCG/ACT inhaler Inhale 2 puffs into the lungs 4 times daily as needed for Wheezing 18 g 2    montelukast (SINGULAIR) 10 MG tablet TAKE ONE TABLET BY MOUTH ONCE NIGHTLY 30 tablet 11    ondansetron (ZOFRAN-ODT) 4 MG disintegrating tablet Take 1 tablet by mouth 3 times daily as needed for Nausea or Vomiting 21 tablet 0     No current facility-administered medications for this visit.       Allergies   Allergen Reactions    Claritin [Loratadine] Hives    Cetirizine Rash       Subjective:      Review of Systems   Constitutional:  Negative for fever.   HENT:  Negative for sinus pain and sore throat.    Respiratory:  Negative for cough, chest tightness and shortness of breath.    Cardiovascular:  Negative for chest pain and palpitations.   Gastrointestinal:  Negative for constipation, diarrhea, nausea and vomiting.   Genitourinary:  Negative for dysuria and urgency.   Skin:  Negative for rash.   Neurological:  Negative for dizziness and weakness.  Psychiatric/Behavioral:  Positive for decreased concentration and dysphoric mood. Negative for behavioral problems, sleep disturbance and suicidal ideas. The patient is nervous/anxious.        Objective:     Vitals:    02/04/22 1041   BP: 118/62   Pulse: 73   Temp: 98.4 F (36.9 C)   TempSrc: Temporal   SpO2: 98%   Weight: 93.6 kg (206 lb 6.4 oz)   Height: 1.778 m (5\' 10" )       Physical Exam  Constitutional:       Appearance: Normal appearance. He is well-developed.   HENT:      Head: Normocephalic.      Right Ear: Hearing and external ear normal.      Left Ear: Hearing and external ear normal.      Nose: Nose normal.   Eyes:      General: Lids are normal. Lids are everted, no foreign bodies appreciated.      Conjunctiva/sclera: Conjunctivae normal.      Pupils: Pupils are equal, round, and reactive  to light.   Neck:      Thyroid: No thyroid mass.      Vascular: Normal carotid pulses. No carotid bruit or JVD.   Cardiovascular:      Rate and Rhythm: Normal rate and regular rhythm. No extrasystoles are present.     Chest Wall: PMI is not displaced.      Pulses:           Radial pulses are 2+ on the right side and 2+ on the left side.        Dorsalis pedis pulses are 2+ on the right side and 2+ on the left side.      Heart sounds: Normal heart sounds, S1 normal and S2 normal. Heart sounds not distant. No murmur heard.     No friction rub. No gallop. No S3 or S4 sounds.   Pulmonary:      Effort: Pulmonary effort is normal. No respiratory distress.      Breath sounds: Normal breath sounds. No decreased breath sounds, wheezing, rhonchi or rales.   Abdominal:      General: Bowel sounds are normal. There is no distension.      Palpations: Abdomen is soft.      Tenderness: There is no abdominal tenderness. There is no rebound.   Musculoskeletal:         General: Normal range of motion.      Cervical back: Full passive range of motion without pain, normal range of motion and neck supple.   Skin:     General: Skin is warm and dry.   Neurological:      Cranial Nerves: No cranial nerve deficit.   Psychiatric:         Speech: Speech normal.         Behavior: Behavior normal.         Thought Content: Thought content normal.         Judgment: Judgment normal.         Assessment & Plan:     The following diagnoses and conditions are stable with no further orders unless indicated:    1. Attention deficit hyperactivity disorder (ADHD), unspecified ADHD type    2. Moderate persistent asthma with acute exacerbation        Tim Ayers was seen today for other.    Diagnoses and all orders for this visit:    Attention deficit hyperactivity disorder (ADHD), unspecified ADHD  type  -     atomoxetine (STRATTERA) 40 MG capsule; Take 1 capsule by mouth daily    Completed screening questionnaire in office which showed likely ADD/ADHD symptoms.  Will trial nonstimulant management to address inattentive symptoms. If persistent, he will likely need to consult with psychiatry for management. Encouraged stress relief activities to provide relaxation. He should also create a daily planner to implement specific time to allot for studying/school work.     Moderate persistent asthma with acute exacerbation  -     budesonide-formoterol (SYMBICORT) 80-4.5 MCG/ACT AERO; Inhale 2 puffs twice per day. Rinse mouth after taking inhaler.    Symptoms stable. Continue to monitor.     Return in about 8 weeks (around 04/01/2022).      Patientshould call the office immediately with new or ongoing signs or symptoms or worsening, or proceed to the emergency room.  If you are on medications which could impair your senses, you are at risk of weakness, falls,dizziness, or drowsiness.  You should be careful during activities which could place you at risk of harm, such as climbing, using stairs, operating machinery, or driving vehicles.  If you feel you cannot safely do theseactivities, you should request others to help you, or avoid the activities altogether. If you are drowsy for any other reason, you should use the same precautions as listed above.    Call if pattern of symptoms change or persists for an extended time.      Wess Botts

## 2022-04-01 ENCOUNTER — Ambulatory Visit: Admit: 2022-04-01 | Discharge: 2022-04-01 | Payer: PRIVATE HEALTH INSURANCE | Attending: Family | Primary: Family

## 2022-04-01 DIAGNOSIS — R41840 Attention and concentration deficit: Secondary | ICD-10-CM

## 2022-04-01 NOTE — Progress Notes (Signed)
[facility]  [facility]  [address] [place], [place] [address]  Tel:[phone]    Tim Ayers is a 21 y.o. male who presents today for his medical conditions/complaints as noted below.  Tim Ayers is c/o of Medication Check Wilhemena Durie)      Chief Complaint   Patient presents with    Medication Check     Strattera       HPI:     ADD/ADHD:  Current treatment: Strattera, which has been ineffective.  Residual symptoms: inattention. Medication side effects: None. Patient denies anorexia, nausea, vomiting, abdominal pain, involuntary weight loss, palpitations, insomnia, irritability, anxiety, headache, and tremor. He states he is interested in discussing with psychiatry regarding further treatments.     He also describes a lesion under his tongue which has been present for roughly 6 weeks. States it has largely been persistent however worsens when he eats and in the evening. Denies foul taste in mouth, tobacco product use, risky sexual practices. He states the lesion is painful overall though.             Past Medical History:   Diagnosis Date    Asthma         Past Surgical History:   Procedure Laterality Date    TONSILLECTOMY         Family History   Problem Relation Age of Onset    Asthma Mother     No Known Problems Brother        Social History     Tobacco Use    Smoking status: Former     Current packs/day: 0.50     Average packs/day: 0.5 packs/day for 8.0 years (4.0 ttl pk-yrs)     Types: Cigarettes     Start date: 04/07/2014     Passive exposure: Never    Smokeless tobacco: Never   Substance Use Topics    Alcohol use: Never        Current Outpatient Medications   Medication Sig Dispense Refill    atomoxetine (STRATTERA) 40 MG capsule Take 1 capsule by mouth daily 30 capsule 0    budesonide-formoterol (SYMBICORT) 80-4.5 MCG/ACT AERO Inhale 2 puffs twice per day. Rinse mouth after taking inhaler. 10.2 g 1    albuterol sulfate HFA (VENTOLIN HFA) 108 (90 Base)  MCG/ACT inhaler Inhale 2 puffs into the lungs 4 times daily as needed for Wheezing 18 g 2    montelukast (SINGULAIR) 10 MG tablet TAKE ONE TABLET BY MOUTH ONCE NIGHTLY 30 tablet 11    ondansetron (ZOFRAN-ODT) 4 MG disintegrating tablet Take 1 tablet by mouth 3 times daily as needed for Nausea or Vomiting 21 tablet 0     No current facility-administered medications for this visit.       Allergies   Allergen Reactions    Claritin [Loratadine] Hives    Cetirizine Rash       Subjective:      Review of Systems   Constitutional:  Negative for fever.   HENT:  Positive for mouth sores. Negative for sinus pain and sore throat.    Respiratory:  Negative for cough, chest tightness and shortness of breath.    Cardiovascular:  Negative for chest pain and palpitations.   Gastrointestinal:  Negative for constipation, diarrhea, nausea and vomiting.   Genitourinary:  Negative for dysuria and urgency.   Skin:  Negative for rash.   Neurological:  Negative for dizziness and weakness.   Psychiatric/Behavioral:  Positive for  decreased concentration.        Objective:     Vitals:    04/01/22 1011   BP: 122/78   Pulse: 91   Temp: 98.2 F (36.8 C)   TempSrc: Temporal   SpO2: 97%   Weight: 96.2 kg (212 lb)       Physical Exam  Constitutional:       Appearance: Normal appearance. He is well-developed.   HENT:      Head: Normocephalic.      Right Ear: Hearing and external ear normal.      Left Ear: Hearing and external ear normal.      Nose: Nose normal.      Mouth/Throat:      Lips: Pink.      Mouth: Mucous membranes are moist.      Dentition: Normal dentition. No dental tenderness.      Tongue: Lesions (Raised, pink/red lesion amongest the sublingual mucosa) present.      Pharynx: Oropharynx is clear.   Eyes:      General: Lids are normal. Lids are everted, no foreign bodies appreciated.      Conjunctiva/sclera: Conjunctivae normal.      Pupils: Pupils are equal, round, and reactive to light.   Neck:      Thyroid: No thyroid mass.       Vascular: Normal carotid pulses. No carotid bruit or JVD.   Cardiovascular:      Rate and Rhythm: Normal rate and regular rhythm. No extrasystoles are present.     Chest Wall: PMI is not displaced.      Pulses:           Radial pulses are 2+ on the right side and 2+ on the left side.        Dorsalis pedis pulses are 2+ on the right side and 2+ on the left side.      Heart sounds: Normal heart sounds, S1 normal and S2 normal. Heart sounds not distant. No murmur heard.     No friction rub. No gallop. No S3 or S4 sounds.   Pulmonary:      Effort: Pulmonary effort is normal. No respiratory distress.      Breath sounds: Normal breath sounds. No decreased breath sounds, wheezing, rhonchi or rales.   Abdominal:      General: Bowel sounds are normal. There is no distension.      Palpations: Abdomen is soft.      Tenderness: There is no abdominal tenderness. There is no rebound.   Musculoskeletal:         General: Normal range of motion.      Cervical back: Full passive range of motion without pain, normal range of motion and neck supple.   Lymphadenopathy:      Head:      Right side of head: No submandibular or tonsillar adenopathy.      Left side of head: No submandibular or tonsillar adenopathy.   Skin:     General: Skin is warm and dry.   Neurological:      Cranial Nerves: No cranial nerve deficit.   Psychiatric:         Speech: Speech normal.         Behavior: Behavior normal.         Thought Content: Thought content normal.         Judgment: Judgment normal.         Assessment & Plan:     The following  diagnoses and conditions are stable with no further orders unless indicated:    1. Attention deficit    2. Oral lesion        Hodges was seen today for medication check.    Diagnoses and all orders for this visit:    Attention deficit  -     [facility]FL - Erline Hau, PMHNP, Psychiatry, [place]    Referral to psych for recommendation on ADD management as strattera was ineffective. He did not wish to titrate dose.      Oral lesion  -     [facility]    Recommend consultation with either ENT or dentist for inspection of oral lesion. Recommend salt water gargles to help clean/irrigate the area. He should ensure that he brushes twice daily as well.     No follow-ups on file.      Patientshould call the office immediately with new or ongoing signs or symptoms or worsening, or proceed to the emergency room.  If you are on medications which could impair your senses, you are at risk of weakness, falls,dizziness, or drowsiness.  You should be careful during activities which could place you at risk of harm, such as climbing, using stairs, operating machinery, or driving vehicles.  If you feel you cannot safely do theseactivities, you should request others to help you, or avoid the activities altogether. If you are drowsy for any other reason, you should use the same precautions as listed above.    Call if pattern of symptoms change or persists for an extended time.      Wess Botts

## 2022-04-15 ENCOUNTER — Encounter
Admit: 2022-04-15 | Discharge: 2022-04-15 | Payer: PRIVATE HEALTH INSURANCE | Attending: Otolaryngology | Primary: Family

## 2022-04-15 DIAGNOSIS — K1370 Unspecified lesions of oral mucosa: Secondary | ICD-10-CM

## 2022-04-15 NOTE — Progress Notes (Signed)
[address], [place] [address]  P: [phone]       Patient     Tim Ayers  04-13-2000    ChiefComplaint     Chief Complaint   Patient presents with    New Patient     Patient stated that he has had a mouth lesion for about 2 months, hasn't gone away is irritated when eating.        History of Present Illness     Arney is a 22 year old male here today for evaluation for mouth lesion.  Present for 2 months.  History of smoking, currently uses marijuana on a daily basis.  No history of daily alcohol use.    Past Medical History     Past Medical History:   Diagnosis Date    Asthma        Past Surgical History     Past Surgical History:   Procedure Laterality Date    TONSILLECTOMY         Family History     Family History   Problem Relation Age of Onset    Asthma Mother     No Known Problems Brother        Social History     Social History     Tobacco Use    Smoking status: Former     Current packs/day: 0.50     Average packs/day: 0.5 packs/day for 8.0 years (4.0 ttl pk-yrs)     Types: Cigarettes     Start date: 04/07/2014     Passive exposure: Never    Smokeless tobacco: Never   Substance Use Topics    Alcohol use: Never    Drug use: Yes     Types: Marijuana (Weed)        Allergies     Allergies   Allergen Reactions    Claritin [Loratadine] Hives    Cetirizine Rash       Medications     Current Outpatient Medications   Medication Sig Dispense Refill    budesonide-formoterol (SYMBICORT) 80-4.5 MCG/ACT AERO Inhale 2 puffs twice per day. Rinse mouth after taking inhaler. 10.2 g 1    albuterol sulfate HFA (VENTOLIN HFA) 108 (90 Base) MCG/ACT inhaler Inhale 2 puffs into the lungs 4 times daily as needed for Wheezing 18 g 2    montelukast (SINGULAIR) 10 MG tablet TAKE ONE TABLET BY MOUTH ONCE NIGHTLY 30 tablet 11    ondansetron (ZOFRAN-ODT) 4 MG disintegrating tablet Take 1 tablet by mouth 3 times daily as needed for Nausea or Vomiting 21 tablet 0    atomoxetine (STRATTERA) 40  MG capsule Take 1 capsule by mouth daily 30 capsule 0     No current facility-administered medications for this visit.       Review of Systems     Review of Systems   Constitutional:  Negative for appetite change, chills, fatigue, fever and unexpected weight change.   HENT:  Negative for congestion, ear discharge, ear pain, facial swelling, hearing loss, nosebleeds, postnasal drip, sinus pressure, sneezing, sore throat, tinnitus, trouble swallowing and voice change.         +mouth lesion   Eyes:  Negative for itching.   Respiratory:  Negative for apnea, cough and shortness of breath.    Endocrine: Negative for cold intolerance and heat intolerance.   Musculoskeletal:  Negative for myalgias and neck pain.   Skin:  Negative for rash.  Allergic/Immunologic: Negative for environmental allergies.   Neurological:  Negative for dizziness and headaches.   Psychiatric/Behavioral:  Negative for confusion, decreased concentration and sleep disturbance.          PhysicalExam     Vitals:    04/15/22 1139   BP: 124/80   Pulse: 80   Resp: 16   Weight: 95.3 kg (210 lb)   Height: 1.778 m (5\' 10" )       Constitutional:       Appearance: Normal appearance.   HENT:      Head: Normocephalic.      Nose: Nose normal.      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   Neck:      Musculoskeletal: Normal range of motion.   Neurological:      General: No focal deficit present.      Mental Status: She is alert and oriented to person, place, and time.   Psychiatric:         Mood and Affect: Mood normal.         Behavior: Behavior normal.         Thought Content: Thought content normal.         Procedure     Biopsy Oral Cavity with Simple Closure    Pre OP Dx: floor of mouth lesion  Post OP Dx: same   Consent: verbal  Anesthetics: 1cc of 1% lido with epi  Procedure: the lesion in question was injected with lidocaine with epinephrine and given  adequate time to take effect. Then using a 15 blade the lesion was removed at the base. Bleeding controlled with silver nitrate. The patient tolerated the procedure well. There were no complications.         Assessment and Plan     1. Lesion of mouth  -excisional biopsy  -I will call with results  - SURGICAL PATHOLOGY      No follow-ups on file.      Robinette Haines, DO  04/15/22      Portions of this note were dictated using Dragon. There may be linguistic errors secondary to the use of this program.

## 2022-04-29 ENCOUNTER — Encounter: Payer: PRIVATE HEALTH INSURANCE | Attending: Otolaryngology | Primary: Family

## 2022-07-21 ENCOUNTER — Telehealth: Admit: 2022-07-21 | Discharge: 2022-07-21 | Payer: PRIVATE HEALTH INSURANCE | Attending: Family | Primary: Family

## 2022-07-21 DIAGNOSIS — R062 Wheezing: Secondary | ICD-10-CM

## 2022-07-21 MED ORDER — BUDESONIDE-FORMOTEROL FUMARATE 80-4.5 MCG/ACT IN AERO
80-4.5 | RESPIRATORY_TRACT | 5 refills | 30.00000 days | Status: DC
Start: 2022-07-21 — End: 2024-04-24

## 2022-07-21 NOTE — Telephone Encounter (Signed)
Call #2      Called and Left message for patient to call back about his Virtual Visit appointment at 11:40 a.m.

## 2022-07-21 NOTE — Progress Notes (Signed)
07/21/2022    TELEHEALTH EVALUATION -- Audio/Visual (During COVID-19 public health emergency)    HPI:    Tim Ayers (DOB:  December 04, 2000) has requested an audio/video evaluation for the following concern(s):    Asthma Symptoms:  Patient complains of longstanding history of moderate chest tightness, dyspnea, wheezing, and dyspnea on exertion.  He states these symptoms have improved considerably since beginning symbicort however. No longer experiencing tightness/wheezing as regularly.  Peak flow monitoring:  Yes - recent PFT which showed air trapping.  Suspected triggers include airborne allergens, exercise, poor air quality, strong odors. He reports that he has quit smoking. His smoking use included cigarettes. He started smoking about 7 years ago. He smoked an average of .5 packs per day. He has never been exposed to tobacco smoke. He has never used smokeless tobacco.  Current treatment includes beta agonist inhalers. Using preventive medication(s) consistently: not applicable.   Past treatment includes beta agonist inhalers, steroid inhalers/LABA.  ED treatment for asthma symptoms:  No.  Asthma-related hospitalization No.  Prior PFTs: No.  Prior pneumococcal vaccination: No.  Current pulmonologist or allergist:  No.        Review of Systems   Constitutional:  Negative for fever.   HENT:  Negative for sinus pain and sore throat.    Respiratory:  Negative for cough, chest tightness and shortness of breath.    Cardiovascular:  Negative for chest pain and palpitations.   Gastrointestinal:  Negative for constipation, diarrhea, nausea and vomiting.   Genitourinary:  Negative for dysuria and urgency.   Skin:  Negative for rash.   Neurological:  Negative for dizziness and weakness.       Prior to Visit Medications    Medication Sig Taking? Authorizing Provider   budesonide-formoterol (SYMBICORT) 80-4.5 MCG/ACT AERO Inhale 2 puffs twice per day. Rinse mouth after taking inhaler. Yes Lavell Luster, APRN - CNP   albuterol sulfate  HFA (VENTOLIN HFA) 108 (90 Base) MCG/ACT inhaler Inhale 2 puffs into the lungs 4 times daily as needed for Wheezing Yes Eylin Pontarelli, APRN - CNP   montelukast (SINGULAIR) 10 MG tablet TAKE ONE TABLET BY MOUTH ONCE NIGHTLY Yes Lavell Luster, APRN - CNP   ondansetron (ZOFRAN-ODT) 4 MG disintegrating tablet Take 1 tablet by mouth 3 times daily as needed for Nausea or Vomiting Yes Lavell Luster, APRN - CNP   atomoxetine (STRATTERA) 40 MG capsule Take 1 capsule by mouth daily  Lavell Luster, APRN - CNP       Social History     Tobacco Use    Smoking status: Former     Current packs/day: 0.50     Average packs/day: 0.5 packs/day for 8.3 years (4.1 ttl pk-yrs)     Types: Cigarettes     Start date: 04/07/2014     Passive exposure: Never    Smokeless tobacco: Never   Substance Use Topics    Alcohol use: Never    Drug use: Yes     Types: Marijuana (Weed)        Allergies   Allergen Reactions    Claritin [Loratadine] Hives    Cetirizine Rash   ,   Past Medical History:   Diagnosis Date    Asthma    ,   Past Surgical History:   Procedure Laterality Date    TONSILLECTOMY     ,   Social History     Tobacco Use    Smoking status: Former     Current packs/day: 0.50  Average packs/day: 0.5 packs/day for 8.3 years (4.1 ttl pk-yrs)     Types: Cigarettes     Start date: 04/07/2014     Passive exposure: Never    Smokeless tobacco: Never   Substance Use Topics    Alcohol use: Never    Drug use: Yes     Types: Marijuana Sheran Fava)   ,   Family History   Problem Relation Age of Onset    Asthma Mother     No Known Problems Brother    ,   Immunization History   Administered Date(s) Administered    COVID-19, PFIZER PURPLE top, DILUTE for use, (age 55 y+), 68mcg/0.3mL 12/19/2019, 01/16/2020    DTaP vaccine 02/23/2002, 04/26/2002, 07/01/2002, 06/29/2003, 11/23/2006    DTaP, Annamarie Dawley, (age 6w-6y), IM, 0.88mL 02/23/2002, 04/26/2002, 07/01/2002, 06/29/2003, 11/23/2006    HPV, GARDASIL 9, (age 53y-45y), IM, 0.63mL 12/19/2019, 02/08/2020, 06/19/2020    Hep A,  HAVRIX, VAQTA, (age 67m-18y), IM, 0.28mL 06/12/2009, 01/05/2012    Hep B, ENGERIX-B, RECOMBIVAX-HB, (age Birth - 3y), IM, 0.66mL 01/26/2002, 09/29/2002    Hepatitis B 01/26/2002, 09/29/2002    Hib (HbOC) 01/12/2003    Influenza Virus Vaccine 03/09/2009    Influenza, FLUMIST, (age 52-49 y), Live, Intranasal, 0.95mL 03/09/2009    MMR, PRIORIX, M-M-R II, (age 42m+), SC, 0.79mL 05/12/2003, 11/23/2006    Meningococcal ACWY, MENACTRA (MenACWY-D), (age 27m-55y), IM, 0.89mL 02/26/2014, 11/29/2018    Pneumococcal Conjugate 7-valent (Prevnar7) 02/23/2002, 04/26/2002, 07/01/2002, 01/12/2003    Poliovirus, IPOL, (age 6w+), SC/IM, 0.66mL 03/24/2002, 09/29/2002, 05/12/2003, 06/29/2003, 11/23/2006    Varicella, VARIVAX, (age 66m+), SC, 0.61mL 06/29/2003, 11/23/2006   ,   Health Maintenance   Topic Date Due    Hepatitis B vaccine (4 of 4 - 4-dose series) 11/24/2002    Pneumococcal 0-64 years Vaccine (1 of 2 - PCV) 12/24/2007    DTaP/Tdap/Td vaccine (6 - Tdap) 12/23/2012    HIV screen  Never done    Hepatitis C screen  Never done    COVID-19 Vaccine (3 - 2023-24 season) 12/06/2021    Depression Screen  05/20/2022    Flu vaccine (Season Ended) 11/06/2022    Hepatitis A vaccine  Completed    HPV vaccine  Completed    Polio vaccine  Completed    Varicella vaccine  Completed    Meningococcal (ACWY) vaccine  Completed    Hib vaccine  Aged [employer] (MMR) vaccine  Discontinued         PHYSICAL EXAMINATION:  [ INSTRUCTIONS:  " " Indicates a positive item  " " Indicates a negative item  -- DELETE ALL ITEMS NOT EXAMINED]  Vital Signs: (As obtained by patient/caregiver or practitioner observation)    Blood pressure-  Heart rate-    Respiratory rate-    Temperature-  Pulse oximetry-     Constitutional:  Appears well-developed and well-nourished  No apparent distress       Abnormal-   Mental status   Alert and awake   Oriented to person/place/time Able to follow commands      Eyes:  EOM      Normal    Abnormal-  Sclera    Normal   Abnormal -         Discharge   None visible   Abnormal -    HENT:    Normocephalic, atraumatic.   Abnormal    Mouth/Throat: Mucous membranes are moist.     External Ears  Normal   Abnormal-     Neck: [  x] No visualized mass     Pulmonary/Chest: [x]  Respiratory effort normal.  [x]  No visualized signs of difficulty breathing or respiratory distress        []  Abnormal-      Musculoskeletal:   [x]  Normal gait with no signs of ataxia         [x]  Normal range of motion of neck        []  Abnormal-       Neurological:        [x]  No Facial Asymmetry (Cranial nerve 7 motor function) (limited exam to video visit)          []  No gaze palsy        []  Abnormal-         Skin:        [x]  No significant exanthematous lesions or discoloration noted on facial skin         []  Abnormal-            Psychiatric:       [x]  Normal Affect [x]  No Hallucinations        []  Abnormal-     Other pertinent observable physical exam findings-       ASSESSMENT/PLAN:    1. Wheezing    2. History of smoking    3. Moderate persistent asthma with acute exacerbation        Tim Ayers was seen today for medication refill.    Diagnoses and all orders for this visit:    Wheezing    History of smoking    Moderate persistent asthma with acute exacerbation  -     budesonide-formoterol (SYMBICORT) 80-4.5 MCG/ACT AERO; Inhale 2 puffs twice per day. Rinse mouth after taking inhaler.    Continue ICS/LABA. Albuterol as needed. Educated that smoking is major cause of disease and the leading preventable cause of death. Encouraged smoking cessation efforts. Educated regarding particular modalities including weaning, nicotine replacement, medication assistance (chantix, zyban).       No follow-ups on file.    Tim Ayers is a 22 y.o. male being evaluated by a Virtual Visit (video visit) encounter to address concerns as mentioned above.  A caregiver was present when appropriate. Due to this being a Scientist, research (medical)  (During COVID-19 public health emergency), evaluation of the following organ systems was limited: Vitals/Constitutional/EENT/Resp/CV/GI/GU/MS/Neuro/Skin/Heme-Lymph-Imm.  Pursuant to the emergency declaration under the [facility] Act and the [employer], 1135 waiver authority and the [employer] and [employer] Act, this Virtual Visit was conducted with patient's (and/or legal guardian's) consent, to reduce the patient's risk of exposure to COVID-19 and provide necessary medical care.  The patient (and/or legal guardian) has also been advised to contact this office for worsening conditions or problems, and seek emergency medical treatment and/or call 911 if deemed necessary.     Services were provided through a phone discussion virtually to substitute for in-person clinic visit. Patient and provider were located at their individual homes.    Consent:  He and/or health care decision maker is aware that that he may receive a bill for this telephone service, depending on his insurance coverage, and has provided verbal consent to proceed: Yes    Total Time: minutes: 11-20 minutes    --Lavell Luster, APRN - CNP on 07/21/2022 at 12:40 PM    An electronic signature was used to authenticate this note.

## 2022-07-22 ENCOUNTER — Encounter: Payer: PRIVATE HEALTH INSURANCE | Attending: Family | Primary: Family

## 2022-10-03 ENCOUNTER — Telehealth: Admit: 2022-10-03 | Discharge: 2022-10-03 | Payer: PRIVATE HEALTH INSURANCE | Attending: Family | Primary: Family

## 2022-10-03 DIAGNOSIS — L03032 Cellulitis of left toe: Secondary | ICD-10-CM

## 2022-10-03 MED ORDER — CEPHALEXIN 500 MG PO CAPS
500 | ORAL_CAPSULE | Freq: Three times a day (TID) | ORAL | 0 refills | Status: AC
Start: 2022-10-03 — End: 2022-10-10

## 2022-10-03 NOTE — Progress Notes (Signed)
10/03/2022    TELEHEALTH EVALUATION --     HPI:    Tim Ayers (DOB:  01-03-2001) has requested an audio/video evaluation for the following concern(s):    Toe Pain   The incident occurred 5 to 7 days ago. The incident occurred at work (States he has to wear safety toe shoes which he feels began the ingrown tow). There was no injury mechanism. Pain location: Left great toe. The quality of the pain is described as aching. The pain is at a severity of 6/10. The pain is moderate. The pain has been Constant since onset. Associated symptoms include an inability to bear weight. He reports no foreign bodies present. The symptoms are aggravated by movement, weight bearing and palpation. Treatments tried: Warm soaks, hibiclens wash. The treatment provided no relief.       Review of Systems   Constitutional:  Negative for fever.   HENT:  Negative for sinus pain and sore throat.    Respiratory:  Negative for cough, chest tightness and shortness of breath.    Cardiovascular:  Negative for chest pain and palpitations.   Gastrointestinal:  Negative for constipation, diarrhea, nausea and vomiting.   Genitourinary:  Negative for dysuria and urgency.   Skin:  Positive for color change (Left great toe redness, swelling due to ingrown toenail). Negative for rash.   Neurological:  Negative for dizziness and weakness.       Prior to Visit Medications    Medication Sig Taking? Authorizing Provider   cephALEXin (KEFLEX) 500 MG capsule Take 1 capsule by mouth 3 times daily for 7 days Yes Lavell Luster, APRN - CNP   budesonide-formoterol (SYMBICORT) 80-4.5 MCG/ACT AERO Inhale 2 puffs twice per day. Rinse mouth after taking inhaler. Yes Lavell Luster, APRN - CNP   albuterol sulfate HFA (VENTOLIN HFA) 108 (90 Base) MCG/ACT inhaler Inhale 2 puffs into the lungs 4 times daily as needed for Wheezing Yes Lavell Luster, APRN - CNP   ondansetron (ZOFRAN-ODT) 4 MG disintegrating tablet Take 1 tablet by mouth 3 times daily as needed for Nausea or Vomiting  Yes Lavell Luster, APRN - CNP   atomoxetine (STRATTERA) 40 MG capsule Take 1 capsule by mouth daily  Maxima Skelton, [name], APRN - CNP   montelukast (SINGULAIR) 10 MG tablet TAKE ONE TABLET BY MOUTH ONCE NIGHTLY  Patient not taking: Reported on 10/03/2022  Lavell Luster, APRN - CNP       Social History     Tobacco Use    Smoking status: Former     Current packs/day: 0.50     Average packs/day: 0.5 packs/day for 8.5 years (4.2 ttl pk-yrs)     Types: Cigarettes     Start date: 04/07/2014     Passive exposure: Never    Smokeless tobacco: Never   Substance Use Topics    Alcohol use: Never    Drug use: Yes     Types: Marijuana (Weed)        Allergies   Allergen Reactions    Claritin [Loratadine] Hives    Cetirizine Rash   ,   Past Medical History:   Diagnosis Date    Asthma    ,   Past Surgical History:   Procedure Laterality Date    TONSILLECTOMY     ,   Social History     Tobacco Use    Smoking status: Former     Current packs/day: 0.50     Average packs/day: 0.5 packs/day for 8.5 years (4.2 ttl pk-yrs)  Types: Cigarettes     Start date: 04/07/2014     Passive exposure: Never    Smokeless tobacco: Never   Substance Use Topics    Alcohol use: Never    Drug use: Yes     Types: Marijuana Sheran Fava)   ,   Family History   Problem Relation Age of Onset    Asthma Mother     No Known Problems Brother    ,   Immunization History   Administered Date(s) Administered    COVID-19, PFIZER PURPLE top, DILUTE for use, (age 31 y+), 7mcg/0.3mL 12/19/2019, 01/16/2020    DTaP vaccine 02/23/2002, 04/26/2002, 07/01/2002, 06/29/2003, 11/23/2006    DTaP, Annamarie Dawley, (age 6w-6y), IM, 0.22mL 02/23/2002, 04/26/2002, 07/01/2002, 06/29/2003, 11/23/2006    HPV, GARDASIL 9, (age 4y-45y), IM, 0.75mL 12/19/2019, 02/08/2020, 06/19/2020    Hep A, HAVRIX, VAQTA, (age 52m-18y), IM, 0.55mL 06/12/2009, 01/05/2012    Hep B, ENGERIX-B, RECOMBIVAX-HB, (age Birth - 49y), IM, 0.87mL 01/26/2002, 09/29/2002    Hepatitis B 01/26/2002, 09/29/2002    Hib (HbOC) 01/12/2003    Influenza  Virus Vaccine 03/09/2009    Influenza, FLUMIST, (age 73-49 y), Live, Intranasal, 0.49mL 03/09/2009    MMR, PRIORIX, M-M-R II, (age 25m+), SC, 0.41mL 05/12/2003, 11/23/2006    Meningococcal ACWY, MENACTRA (MenACWY-D), (age 58m-55y), IM, 0.25mL 02/26/2014, 11/29/2018    Pneumococcal Conjugate 7-valent (Prevnar7) 02/23/2002, 04/26/2002, 07/01/2002, 01/12/2003    Poliovirus, IPOL, (age 6w+), SC/IM, 0.28mL 03/24/2002, 09/29/2002, 05/12/2003, 06/29/2003, 11/23/2006    Varicella, VARIVAX, (age 45m+), SC, 0.68mL 06/29/2003, 11/23/2006   ,   Health Maintenance   Topic Date Due    Hepatitis B vaccine (4 of 4 - 4-dose series) 11/24/2002    Pneumococcal 0-64 years Vaccine (1 of 2 - PCV) 12/24/2007    DTaP/Tdap/Td vaccine (6 - Tdap) 12/23/2012    HIV screen  Never done    Hepatitis C screen  Never done    COVID-19 Vaccine (3 - 2023-24 season) 12/06/2021    Depression Screen  05/20/2022    Flu vaccine (Season Ended) 11/06/2022    Hepatitis A vaccine  Completed    HPV vaccine  Completed    Polio vaccine  Completed    Varicella vaccine  Completed    Meningococcal (ACWY) vaccine  Completed    Hib vaccine  Aged [employer] (MMR) vaccine  Discontinued         PHYSICAL EXAMINATION:  [ INSTRUCTIONS:  "[x] " Indicates a positive item  "[] " Indicates a negative item  -- DELETE ALL ITEMS NOT EXAMINED]  Vital Signs: (As obtained by patient/caregiver or practitioner observation)    Blood pressure-  Heart rate-    Respiratory rate-    Temperature-  Pulse oximetry-     Constitutional: [x]  Appears well-developed and well-nourished [x]  No apparent distress      []  Abnormal-   Mental status  [x]  Alert and awake  [x]  Oriented to person/place/time [x] Able to follow commands      Eyes:  EOM    [x]   Normal  []  Abnormal-  Sclera  [x]   Normal  []  Abnormal -         Discharge [x]   None visible  []  Abnormal -    HENT:   [x]  Normocephalic, atraumatic.  []  Abnormal   [x]  Mouth/Throat: Mucous membranes are moist.     External Ears [x]  Normal  []   Abnormal-     Neck: [x]  No visualized mass     Pulmonary/Chest: [x]  Respiratory effort normal.  [  x] No visualized signs of difficulty breathing or respiratory distress        []  Abnormal-      Musculoskeletal:   [x]  Normal gait with no signs of ataxia         [x]  Normal range of motion of neck        []  Abnormal-       Neurological:        [x]  No Facial Asymmetry (Cranial nerve 7 motor function) (limited exam to video visit)          []  No gaze palsy        []  Abnormal-         Skin:        [x]  No significant exanthematous lesions or discoloration noted on facial skin         []  Abnormal-            Psychiatric:       [x]  Normal Affect [x]  No Hallucinations        []  Abnormal-     Other pertinent observable physical exam findings-       ASSESSMENT/PLAN:    1. Paronychia of great toe, left        Whitley was seen today for other.    Diagnoses and all orders for this visit:    Paronychia of great toe, left  -     cephALEXin (KEFLEX) 500 MG capsule; Take 1 capsule by mouth 3 times daily for 7 days    Continue at home care including epsom salt soaks, hiblcens cleaning. Avoid extended periods of moisture to the foot. Will trial cephalexin to counter likely abscess/infection. Consider podiatry if symptoms persist    No follow-ups on file.    Gerard Suddith, was evaluated through a synchronous (real-time) audio-video encounter. The patient (or guardian if applicable) is aware that this is a billable service, which includes applicable co-pays. This Virtual Visit was conducted with patient's (and/or legal guardian's) consent. Patient identification was verified, and a caregiver was present when appropriate.   The patient was located at Home: [address]  [place] [address]  Provider was located at [employer] (Appt Dept): [address]  [address]  [place],  [place] [address]  Confirm you are appropriately licensed, registered, or certified to deliver care in the state where the patient is located as indicated above. If you  are not or unsure, please re-schedule the visit: Yes, I confirm.      Total time spent for this encounter: Not billed by time    --Lavell Luster, APRN - CNP on 10/03/2022 at 9:57 AM    An electronic signature was used to authenticate this note.

## 2022-10-21 ENCOUNTER — Encounter: Payer: PRIVATE HEALTH INSURANCE | Attending: Family | Primary: Family

## 2022-10-27 ENCOUNTER — Encounter: Admit: 2022-10-27 | Discharge: 2022-10-27 | Payer: PRIVATE HEALTH INSURANCE | Attending: Family | Primary: Family

## 2022-10-27 DIAGNOSIS — L03032 Cellulitis of left toe: Secondary | ICD-10-CM

## 2022-10-27 MED ORDER — SULFAMETHOXAZOLE-TRIMETHOPRIM 800-160 MG PO TABS
800-160 | ORAL_TABLET | Freq: Two times a day (BID) | ORAL | 0 refills | Status: DC
Start: 2022-10-27 — End: 2022-10-27

## 2022-10-27 MED ORDER — SULFAMETHOXAZOLE-TRIMETHOPRIM 800-160 MG PO TABS
800-160 | ORAL_TABLET | Freq: Two times a day (BID) | ORAL | 0 refills | Status: AC
Start: 2022-10-27 — End: 2022-11-03

## 2022-10-27 NOTE — Progress Notes (Signed)
[facility]  [facility]  [address] [place], [place] [address]  Tel:[phone]    Tim Ayers is a 22 y.o. male who presents today for his medical conditions/complaints as noted below.  Tim Ayers is c/o of acute (Ingrown Toenail Infection/Left Big Toe)    Chief Complaint   Patient presents with    acute     Ingrown Toenail Infection/Left Big Toe     HPI:     The incident occurred 4-5 weeks ago. The incident occurred at work (States he has to wear safety toe shoes which he feels began the ingrown toe). There was no injury mechanism. Pain location: Left medial great toe. The quality of the pain is described as aching, usually most sore when he has been on his feet all day. The pain is at a severity of 6/10. The pain is moderate. The pain has been Constant since onset. Associated symptoms include an inability to bear weight. He reports no foreign bodies present. The symptoms are aggravated by movement, weight bearing and palpation. Treatments tried: Warm soaks, hibiclens wash, previous abx which was taken inconsistently. The treatment provided mild relief however swelling has persisted. He did attempt to manually remove the ingrown portion of the nail which did help symptoms initially          Past Medical History:   Diagnosis Date    Asthma       Past Surgical History:   Procedure Laterality Date    TONSILLECTOMY       Family History   Problem Relation Age of Onset    Asthma Mother     No Known Problems Brother      Social History     Tobacco Use    Smoking status: Former     Current packs/day: 0.50     Average packs/day: 0.5 packs/day for 8.6 years (4.3 ttl pk-yrs)     Types: Cigarettes     Start date: 04/07/2014     Passive exposure: Never    Smokeless tobacco: Never   Substance Use Topics    Alcohol use: Never      Current Outpatient Medications   Medication Sig Dispense Refill    sulfamethoxazole-trimethoprim (BACTRIM DS;SEPTRA DS) 800-160 MG per tablet Take 1  tablet by mouth 2 times daily for 7 days 14 tablet 0    budesonide-formoterol (SYMBICORT) 80-4.5 MCG/ACT AERO Inhale 2 puffs twice per day. Rinse mouth after taking inhaler. 10.2 g 5    albuterol sulfate HFA (VENTOLIN HFA) 108 (90 Base) MCG/ACT inhaler Inhale 2 puffs into the lungs 4 times daily as needed for Wheezing 18 g 2    ondansetron (ZOFRAN-ODT) 4 MG disintegrating tablet Take 1 tablet by mouth 3 times daily as needed for Nausea or Vomiting 21 tablet 0    atomoxetine (STRATTERA) 40 MG capsule Take 1 capsule by mouth daily 30 capsule 0     No current facility-administered medications for this visit.     Allergies   Allergen Reactions    Claritin [Loratadine] Hives    Cetirizine Rash     Subjective:      Review of Systems   Constitutional:  Negative for fever.   HENT:  Negative for ear discharge, ear pain, hearing loss, postnasal drip, rhinorrhea, sinus pressure, sinus pain and sore throat.    Respiratory:  Negative for cough, chest tightness and shortness of breath.    Cardiovascular:  Negative for chest pain and palpitations.  Gastrointestinal:  Negative for constipation, diarrhea, nausea and vomiting.   Genitourinary:  Negative for dysuria and urgency.   Skin:  Positive for color change and rash.        Red, swollen, tender  left great toe.    Neurological:  Negative for dizziness and weakness.       Objective:     Vitals:    10/27/22 1346   BP: 134/73   Site: Right Upper Arm   Position: Sitting   Cuff Size: Medium Adult   Pulse: 79   Temp: 97.5 F (36.4 C)   TempSrc: Temporal   SpO2: 99%   Weight: 94.8 kg (209 lb)   Height: 1.778 m (5\' 10" )       Physical Exam  Vitals and nursing note reviewed.   Constitutional:       Appearance: Normal appearance. He is well-developed.   HENT:      Head: Normocephalic and atraumatic.      Right Ear: Hearing and external ear normal.      Left Ear: Hearing and external ear normal.      Nose: Nose normal.   Eyes:      General: Lids are normal.      Pupils: Pupils are equal,  round, and reactive to light.   Cardiovascular:      Rate and Rhythm: Normal rate.      Pulses: Normal pulses.   Pulmonary:      Effort: Pulmonary effort is normal. No accessory muscle usage or respiratory distress.   Abdominal:      General: Bowel sounds are normal.      Palpations: Abdomen is soft.   Musculoskeletal:      Cervical back: Normal range of motion.   Feet:      Left foot:      Skin integrity: Skin breakdown, erythema and warmth present.      Toenail Condition: Left toenails are ingrown.   Skin:     General: Skin is warm and dry.   Neurological:      Mental Status: He is alert.   Psychiatric:         Speech: Speech normal.         Assessment & Plan:     The following diagnoses and conditions are stable with no further orders unless indicated:    1. Paronychia of great toe of left foot        Tim Ayers was seen today for acute.    Diagnoses and all orders for this visit:    Paronychia of great toe of left foot  -     Discontinue: sulfamethoxazole-trimethoprim (BACTRIM DS;SEPTRA DS) 800-160 MG per tablet; Take 1 tablet by mouth 2 times daily for 10 days  -     sulfamethoxazole-trimethoprim (BACTRIM DS;SEPTRA DS) 800-160 MG per tablet; Take 1 tablet by mouth 2 times daily for 7 days  -     [facility] - Frey, Alvira Philips, DPM, Podiatry, [place]    Trial of abx to counter paronychia. Podiatry if symptoms persistent or worsen. Ok to continue warm water irrigations, however would not suggest soaks to prevent stagnant water exposure. Cover during the day to prevent further trauma.     No follow-ups on file.      Patientshould call the office immediately with new or ongoing signs or symptoms or worsening, or proceed to the emergency room.  If you are on medications which could impair your senses, you are at risk of weakness, falls,dizziness,  or drowsiness.  You should be careful during activities which could place you at risk of harm, such as climbing, using stairs, operating machinery, or driving vehicles.  If you feel  you cannot safely do theseactivities, you should request others to help you, or avoid the activities altogether. If you are drowsy for any other reason, you should use the same precautions as listed above.    Call if pattern of symptoms change or persists for an extended time.      Wess Botts

## 2023-09-11 ENCOUNTER — Encounter: Payer: PRIVATE HEALTH INSURANCE | Attending: Family | Primary: Family

## 2024-03-11 ENCOUNTER — Encounter: Payer: PRIVATE HEALTH INSURANCE | Attending: Family | Primary: Family

## 2024-04-24 ENCOUNTER — Encounter

## 2024-04-25 MED ORDER — BUDESONIDE-FORMOTEROL FUMARATE 80-4.5 MCG/ACT IN AERO
80-4.5 | RESPIRATORY_TRACT | 5 refills | 30.00000 days | Status: AC
Start: 2024-04-25 — End: ?

## 2024-04-25 MED ORDER — ALBUTEROL SULFATE HFA 108 (90 BASE) MCG/ACT IN AERS
108 | Freq: Four times a day (QID) | RESPIRATORY_TRACT | 2 refills | 25.00000 days | Status: AC | PRN
Start: 2024-04-25 — End: ?

## 2024-04-25 NOTE — Telephone Encounter (Signed)
 Refill request for Albuterol  Symbicort   medication.     Name of Pharmacy- [facility]       Last visit - 10/27/22      Pending visit - None     Last refill -07/21/22 12/31/21
